# Patient Record
Sex: Male | Born: 1970
Health system: Southern US, Community
[De-identification: ages and names within clinical notes are randomized; demographics above are authoritative.]

## PROBLEM LIST (undated history)

## (undated) DIAGNOSIS — J939 Pneumothorax, unspecified: Secondary | ICD-10-CM

## (undated) DIAGNOSIS — L0390 Cellulitis, unspecified: Secondary | ICD-10-CM

## (undated) DIAGNOSIS — W3400XA Accidental discharge from unspecified firearms or gun, initial encounter: Secondary | ICD-10-CM

## (undated) HISTORY — PX: OTHER SURGICAL HISTORY: SHX169

## (undated) HISTORY — PX: HERNIA REPAIR: SHX51

---

## 1996-04-11 HISTORY — PX: HIATAL HERNIA REPAIR: SHX195

## 2001-08-27 ENCOUNTER — Emergency Department (HOSPITAL_COMMUNITY): Admission: EM | Admit: 2001-08-27 | Discharge: 2001-08-27 | Payer: Self-pay

## 2001-08-29 ENCOUNTER — Emergency Department (HOSPITAL_COMMUNITY): Admission: EM | Admit: 2001-08-29 | Discharge: 2001-08-29 | Payer: Self-pay | Admitting: Emergency Medicine

## 2001-10-24 ENCOUNTER — Emergency Department (HOSPITAL_COMMUNITY): Admission: EM | Admit: 2001-10-24 | Discharge: 2001-10-24 | Payer: Self-pay | Admitting: Emergency Medicine

## 2007-09-07 ENCOUNTER — Ambulatory Visit: Payer: Self-pay | Admitting: Family Medicine

## 2007-09-07 DIAGNOSIS — F17200 Nicotine dependence, unspecified, uncomplicated: Secondary | ICD-10-CM | POA: Insufficient documentation

## 2007-09-07 DIAGNOSIS — B351 Tinea unguium: Secondary | ICD-10-CM | POA: Insufficient documentation

## 2007-09-07 DIAGNOSIS — F1010 Alcohol abuse, uncomplicated: Secondary | ICD-10-CM | POA: Insufficient documentation

## 2007-09-07 DIAGNOSIS — F528 Other sexual dysfunction not due to a substance or known physiological condition: Secondary | ICD-10-CM | POA: Insufficient documentation

## 2008-01-29 ENCOUNTER — Emergency Department (HOSPITAL_COMMUNITY): Admission: EM | Admit: 2008-01-29 | Discharge: 2008-01-29 | Payer: Self-pay | Admitting: Emergency Medicine

## 2008-05-12 ENCOUNTER — Emergency Department (HOSPITAL_COMMUNITY): Admission: EM | Admit: 2008-05-12 | Discharge: 2008-05-12 | Payer: Self-pay | Admitting: Emergency Medicine

## 2011-01-22 ENCOUNTER — Emergency Department (HOSPITAL_COMMUNITY)
Admission: EM | Admit: 2011-01-22 | Discharge: 2011-01-22 | Disposition: A | Discharge status: Home / Self Care | Payer: BC Managed Care – PPO | Attending: Emergency Medicine | Admitting: Emergency Medicine

## 2011-01-22 DIAGNOSIS — M7989 Other specified soft tissue disorders: Secondary | ICD-10-CM | POA: Insufficient documentation

## 2011-01-22 DIAGNOSIS — L03119 Cellulitis of unspecified part of limb: Secondary | ICD-10-CM | POA: Insufficient documentation

## 2011-01-22 DIAGNOSIS — L02619 Cutaneous abscess of unspecified foot: Secondary | ICD-10-CM | POA: Insufficient documentation

## 2011-01-22 LAB — GLUCOSE, CAPILLARY

## 2011-01-23 ENCOUNTER — Emergency Department (HOSPITAL_COMMUNITY)
Admission: EM | Admit: 2011-01-23 | Discharge: 2011-01-23 | Disposition: A | Discharge status: Home / Self Care | Payer: BC Managed Care – PPO | Attending: Emergency Medicine | Admitting: Emergency Medicine

## 2011-01-23 DIAGNOSIS — L03119 Cellulitis of unspecified part of limb: Secondary | ICD-10-CM | POA: Insufficient documentation

## 2011-01-23 DIAGNOSIS — L02619 Cutaneous abscess of unspecified foot: Secondary | ICD-10-CM | POA: Insufficient documentation

## 2011-01-23 DIAGNOSIS — Z09 Encounter for follow-up examination after completed treatment for conditions other than malignant neoplasm: Secondary | ICD-10-CM | POA: Insufficient documentation

## 2011-04-04 ENCOUNTER — Emergency Department (HOSPITAL_COMMUNITY)
Admission: EM | Admit: 2011-04-04 | Discharge: 2011-04-05 | Disposition: A | Discharge status: Home / Self Care | Payer: BC Managed Care – PPO | Attending: Emergency Medicine | Admitting: Emergency Medicine

## 2011-04-04 ENCOUNTER — Encounter: Payer: Self-pay | Admitting: *Deleted

## 2011-04-04 DIAGNOSIS — M109 Gout, unspecified: Secondary | ICD-10-CM | POA: Insufficient documentation

## 2011-04-04 DIAGNOSIS — B351 Tinea unguium: Secondary | ICD-10-CM | POA: Insufficient documentation

## 2011-04-04 DIAGNOSIS — M79609 Pain in unspecified limb: Secondary | ICD-10-CM | POA: Insufficient documentation

## 2011-04-04 HISTORY — DX: Cellulitis, unspecified: L03.90

## 2011-04-04 MED ORDER — INDOMETHACIN 25 MG PO CAPS: 75.0000 mg | ORAL_CAPSULE | Freq: Two times a day (BID) | ORAL | Status: AC

## 2011-04-04 MED ORDER — COLCHICINE 0.6 MG PO TABS
0.6000 mg | ORAL_TABLET | Freq: Once | ORAL | Status: AC
  Administered 2011-04-04: 0.6 mg via ORAL
  Filled 2011-04-04: qty 1

## 2011-04-04 MED ORDER — COLCHICINE 0.6 MG PO TABS: ORAL_TABLET | ORAL | Status: DC

## 2011-04-04 MED ORDER — INDOMETHACIN 50 MG PO CAPS
75.0000 mg | ORAL_CAPSULE | Freq: Once | ORAL | Status: AC
  Administered 2011-04-04: 75 mg via ORAL
  Filled 2011-04-04: qty 1

## 2011-04-04 NOTE — ED Notes (Signed)
X 3 days ago, the pt began to experience right great toe pain and swelling.  Pt presents tonight c/o same.  Pt has hx of same for which he was treated for cellulitis, said treatment did reduce swelling but the pain never fully went away.

## 2011-04-04 NOTE — ED Notes (Signed)
Pt given discharge instructions and verbalizes understanding  

## 2011-04-04 NOTE — ED Provider Notes (Signed)
History     CSN: [id number]  Arrival date & time 04/04/11  2216   First MD Initiated Contact with Patient 04/04/11 2310      Chief Complaint  Patient presents with  . Toe Pain    (Consider location/radiation/quality/duration/timing/severity/associated sxs/prior treatment) HPI Comments: As reports, redness, swelling, and pain to the right great toe joint.  This is been recurrent has been treated in the past for cellulitis with antibiotic, which really did not help the pain, but did decrease.  The swelling.  This episode started 3 days ago.  He has taken no over-the-counter product.  He is also asking for treatment for his toenail fungus, which he has had for many years   Patient is a 40 y.o. male presenting with toe pain. The history is provided by the patient.  Toe Pain This is a recurrent problem. The current episode started in the past 7 days. The problem occurs constantly. The problem has been gradually worsening. Associated symptoms include joint swelling. The symptoms are aggravated by bending and exertion. He has tried nothing for the symptoms.    Past Medical History  Diagnosis Date  . Cellulitis     of right great toe    Past Surgical History  Procedure Date  . Hiatal hernia repair 1998    History reviewed. No pertinent family history.  History  Substance Use Topics  . Smoking status: Not on file  . Smokeless tobacco: Not on file  . Alcohol Use: 8.4 oz/week    14 Shots of liquor per week      Review of Systems  Constitutional: Negative.   HENT: Negative.   Eyes: Negative.   Respiratory: Negative.   Musculoskeletal: Positive for joint swelling.  Neurological: Negative.   Hematological: Negative.   Psychiatric/Behavioral: Negative.     Allergies  Review of patient's allergies indicates no known allergies.  Home Medications   Current Outpatient Rx  Name Route Sig Dispense Refill  . COLCHICINE 0.6 MG PO TABS  1 tablet every 2 hours til pain relief  or GI symptoms  ie diarrhea 10 tablet 0  . INDOMETHACIN 25 MG PO CAPS Oral Take 3 capsules (75 mg total) by mouth 2 (two) times daily with a meal. 27 capsule 0    BP 91/58  Pulse 91  Temp(Src) 99.9 F (37.7 C) (Oral)  Resp 20  SpO2 95%  Physical Exam  Constitutional: He is oriented to person, place, and time. He appears well-developed and well-nourished.  HENT:  Head: Normocephalic.  Eyes: Pupils are equal, round, and reactive to light.  Cardiovascular: Normal rate.   Musculoskeletal:       Erythema and warmth joint of right great toe  Neurological: He is oriented to person, place, and time.  Skin: No rash noted. There is erythema. No pallor.  Psychiatric: He has a normal mood and affect.    ED Course  Procedures (including critical care time)  Labs Reviewed - No data to display No results found.   1. Gout   2. ONYCHOMYCOSIS       MDM  This most likely gout        Arman Filter, NP 04/04/11 2342  Arman Filter, NP 04/04/11 2350

## 2011-04-06 NOTE — ED Provider Notes (Signed)
Evaluation and management procedures were performed by the PA/NP under my supervision/collaboration.   Felisa Bonier, MD 04/06/11 [phone]

## 2011-04-07 ENCOUNTER — Emergency Department (HOSPITAL_COMMUNITY)
Admission: EM | Admit: 2011-04-07 | Discharge: 2011-04-07 | Disposition: A | Discharge status: Home / Self Care | Payer: BC Managed Care – PPO | Attending: Emergency Medicine | Admitting: Emergency Medicine

## 2011-04-07 ENCOUNTER — Encounter (HOSPITAL_COMMUNITY): Payer: Self-pay | Admitting: *Deleted

## 2011-04-07 DIAGNOSIS — R51 Headache: Secondary | ICD-10-CM | POA: Insufficient documentation

## 2011-04-07 DIAGNOSIS — Z888 Allergy status to other drugs, medicaments and biological substances status: Secondary | ICD-10-CM | POA: Insufficient documentation

## 2011-04-07 DIAGNOSIS — R110 Nausea: Secondary | ICD-10-CM | POA: Insufficient documentation

## 2011-04-07 DIAGNOSIS — F17200 Nicotine dependence, unspecified, uncomplicated: Secondary | ICD-10-CM | POA: Insufficient documentation

## 2011-04-07 DIAGNOSIS — Z789 Other specified health status: Secondary | ICD-10-CM

## 2011-04-07 DIAGNOSIS — I10 Essential (primary) hypertension: Secondary | ICD-10-CM | POA: Insufficient documentation

## 2011-04-07 MED ORDER — NAPROXEN 500 MG PO TABS: 500.0000 mg | ORAL_TABLET | Freq: Two times a day (BID) | ORAL | Status: AC

## 2011-04-07 MED ORDER — OXYCODONE-ACETAMINOPHEN 5-325 MG PO TABS
2.0000 | ORAL_TABLET | Freq: Once | ORAL | Status: AC
  Administered 2011-04-07: 2 via ORAL
  Filled 2011-04-07: qty 2

## 2011-04-07 MED ORDER — OXYCODONE-ACETAMINOPHEN 5-325 MG PO TABS: 1.0000 | ORAL_TABLET | Freq: Four times a day (QID) | ORAL | Status: AC | PRN

## 2011-04-07 MED ORDER — IBUPROFEN 800 MG PO TABS
800.0000 mg | ORAL_TABLET | Freq: Once | ORAL | Status: AC
  Administered 2011-04-07: 800 mg via ORAL
  Filled 2011-04-07: qty 1

## 2011-04-07 NOTE — ED Provider Notes (Signed)
History     CSN: [id number]  Arrival date & time 04/07/11  4098   First MD Initiated Contact with Patient 04/07/11 [phone]      Chief Complaint  Patient presents with  . Headache  . Nausea  . Diarrhea    (Consider location/radiation/quality/duration/timing/severity/associated sxs/prior treatment) Patient is a 40 y.o. male presenting with headaches. The history is provided by the patient. No language interpreter was used.  Headache  This is a new problem. The current episode started 2 days ago. The problem occurs constantly. The problem has been gradually improving. Associated with: cholchicine/indomethacin. The pain is located in the temporal region. The quality of the pain is described as throbbing. The pain is moderate. The pain does not radiate. Associated symptoms include nausea. Pertinent negatives include no anorexia, no fever, no malaise/fatigue, no near-syncope, no orthopnea, no palpitations, no syncope, no shortness of breath and no vomiting. He has tried nothing for the symptoms.    Past Medical History  Diagnosis Date  . Cellulitis     of right great toe    Past Surgical History  Procedure Date  . Hiatal hernia repair 1998    No family history on file.  History  Substance Use Topics  . Smoking status: Current Everyday Smoker -- 0.5 packs/day for 15 years  . Smokeless tobacco: Not on file  . Alcohol Use: 8.4 oz/week    14 Shots of liquor per week      Review of Systems  Constitutional: Negative for fever, malaise/fatigue, activity change and appetite change.  HENT: Negative for congestion, sore throat, rhinorrhea, neck pain and neck stiffness.   Eyes: Negative for photophobia and visual disturbance.  Respiratory: Negative for cough and shortness of breath.   Cardiovascular: Negative for chest pain, palpitations, orthopnea, syncope and near-syncope.  Gastrointestinal: Positive for nausea. Negative for vomiting and anorexia.  Genitourinary: Negative for flank  pain.  Neurological: Positive for headaches. Negative for light-headedness and numbness.  All other systems reviewed and are negative.    Allergies  Review of patient's allergies indicates no known allergies.  Home Medications   Current Outpatient Rx  Name Route Sig Dispense Refill  . ACETAMINOPHEN 500 MG PO TABS Oral Take 500 mg by mouth every 6 (six) hours as needed. pain     . COLCHICINE 0.6 MG PO TABS Oral Take 0.6 mg by mouth daily. 1 tablet every 2 hours til pain relief or GI symptoms  ie diarrhea     . INDOMETHACIN 25 MG PO CAPS Oral Take 3 capsules (75 mg total) by mouth 2 (two) times daily with a meal. 27 capsule 0  . NAPROXEN 500 MG PO TABS Oral Take 1 tablet (500 mg total) by mouth 2 (two) times daily. 30 tablet 0  . OXYCODONE-ACETAMINOPHEN 5-325 MG PO TABS Oral Take 1-2 tablets by mouth every 6 (six) hours as needed for pain. 20 tablet 0    BP 107/70  Pulse 101  Temp(Src) 97.8 F (36.6 C) (Oral)  Resp 20  SpO2 99%  Physical Exam  Nursing note and vitals reviewed. Constitutional: He is oriented to person, place, and time. He appears well-developed and well-nourished. No distress.  HENT:  Head: Normocephalic and atraumatic.  Mouth/Throat: Oropharynx is clear and moist. No oropharyngeal exudate.  Eyes: Conjunctivae and EOM are normal. Pupils are equal, round, and reactive to light.  Neck: Normal range of motion. Neck supple.  Cardiovascular: Normal rate, regular rhythm, normal heart sounds and intact distal pulses.  Exam reveals no  gallop and no friction rub.   No murmur heard. Pulmonary/Chest: Effort normal and breath sounds normal. No respiratory distress.  Abdominal: Soft. Bowel sounds are normal. There is no tenderness.  Musculoskeletal: Normal range of motion. He exhibits no tenderness.  Neurological: He is alert and oriented to person, place, and time. He has normal strength. No cranial nerve deficit or sensory deficit.  Skin: Skin is warm and dry. No rash  noted.    ED Course  Procedures (including critical care time)  Labs Reviewed - No data to display No results found.   1. Headache   2. Medication intolerance       MDM  No concern for malignant cause of headache such as subarachnoid hemorrhage. Headache was gradual in onset and has improved. He has no associated neurologic symptoms. With this is secondary to medication reaction. He'll be treated with naproxen and Percocet for pain. Followup as scheduled primary care physician.  Provided sx for which to return to the ED        Dayton Bailiff, MD 04/07/11 [phone]

## 2011-04-07 NOTE — ED Notes (Signed)
Pt reports h/a and n/v/d since Christmas day.  Pt denies any vomiting today but reports mild diarrhea.  Pt is in no distress at this time.  Pt sitting on the side of the stretcher while being assessed.  Pt's family at bedside.

## 2011-04-07 NOTE — ED Notes (Signed)
Pt reports having severe HA, nausea, and diarrhea after taking colchicine and indomethacin on 12/25. Believes this is a reaction to the meds.

## 2011-10-29 ENCOUNTER — Emergency Department (HOSPITAL_COMMUNITY): Admission: EM | Admit: 2011-10-29 | Discharge: 2011-10-29 | Discharge status: Against Medical Advice | Payer: Self-pay

## 2012-09-18 ENCOUNTER — Emergency Department (HOSPITAL_COMMUNITY): Payer: BC Managed Care – PPO

## 2012-09-18 ENCOUNTER — Emergency Department (HOSPITAL_COMMUNITY)
Admission: EM | Admit: 2012-09-18 | Discharge: 2012-09-18 | Disposition: A | Discharge status: Home / Self Care | Payer: BC Managed Care – PPO | Attending: Emergency Medicine | Admitting: Emergency Medicine

## 2012-09-18 ENCOUNTER — Encounter (HOSPITAL_COMMUNITY): Payer: Self-pay

## 2012-09-18 DIAGNOSIS — Z8709 Personal history of other diseases of the respiratory system: Secondary | ICD-10-CM | POA: Insufficient documentation

## 2012-09-18 DIAGNOSIS — Z79899 Other long term (current) drug therapy: Secondary | ICD-10-CM | POA: Insufficient documentation

## 2012-09-18 DIAGNOSIS — R1310 Dysphagia, unspecified: Secondary | ICD-10-CM

## 2012-09-18 DIAGNOSIS — F17200 Nicotine dependence, unspecified, uncomplicated: Secondary | ICD-10-CM | POA: Insufficient documentation

## 2012-09-18 DIAGNOSIS — M542 Cervicalgia: Secondary | ICD-10-CM | POA: Insufficient documentation

## 2012-09-18 DIAGNOSIS — R110 Nausea: Secondary | ICD-10-CM | POA: Insufficient documentation

## 2012-09-18 DIAGNOSIS — Z872 Personal history of diseases of the skin and subcutaneous tissue: Secondary | ICD-10-CM | POA: Insufficient documentation

## 2012-09-18 HISTORY — DX: Pneumothorax, unspecified: J93.9

## 2012-09-18 NOTE — ED Notes (Signed)
Patient reports that he was eating a cheeseburger and feels like something is stuck in his throat. Patient denies any breathing difficulties, but is having pain when he breathes in and swallows.
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
## 2013-05-13 ENCOUNTER — Emergency Department (HOSPITAL_COMMUNITY)
Admission: EM | Admit: 2013-05-13 | Discharge: 2013-05-13 | Disposition: A | Discharge status: Home / Self Care | Payer: BC Managed Care – PPO | Attending: Emergency Medicine | Admitting: Emergency Medicine

## 2013-05-13 ENCOUNTER — Encounter (HOSPITAL_COMMUNITY): Payer: Self-pay | Admitting: Emergency Medicine

## 2013-05-13 ENCOUNTER — Emergency Department (HOSPITAL_COMMUNITY): Payer: BC Managed Care – PPO

## 2013-05-13 DIAGNOSIS — Z872 Personal history of diseases of the skin and subcutaneous tissue: Secondary | ICD-10-CM | POA: Insufficient documentation

## 2013-05-13 DIAGNOSIS — R109 Unspecified abdominal pain: Secondary | ICD-10-CM | POA: Insufficient documentation

## 2013-05-13 DIAGNOSIS — F17200 Nicotine dependence, unspecified, uncomplicated: Secondary | ICD-10-CM | POA: Insufficient documentation

## 2013-05-13 DIAGNOSIS — Z8709 Personal history of other diseases of the respiratory system: Secondary | ICD-10-CM | POA: Insufficient documentation

## 2013-05-13 DIAGNOSIS — A084 Viral intestinal infection, unspecified: Secondary | ICD-10-CM

## 2013-05-13 DIAGNOSIS — A088 Other specified intestinal infections: Secondary | ICD-10-CM | POA: Insufficient documentation

## 2013-05-13 LAB — CBC WITH DIFFERENTIAL/PLATELET
BASOS ABS: 0 10*3/uL (ref 0.0–0.1)
BASOS PCT: 0 % (ref 0–1)
EOS PCT: 3 % (ref 0–5)
Eosinophils Absolute: 0.4 10*3/uL (ref 0.0–0.7)
HEMATOCRIT: 37.2 % — AB (ref 39.0–52.0)
HEMOGLOBIN: 13.4 g/dL (ref 13.0–17.0)
LYMPHS ABS: 4.2 10*3/uL — AB (ref 0.7–4.0)
Lymphocytes Relative: 32 % (ref 12–46)
MCH: 29.5 pg (ref 26.0–34.0)
MCHC: 36 g/dL (ref 30.0–36.0)
MCV: 81.9 fL (ref 78.0–100.0)
Monocytes Absolute: 0.7 10*3/uL (ref 0.1–1.0)
Monocytes Relative: 6 % (ref 3–12)
NEUTROS PCT: 59 % (ref 43–77)
Neutro Abs: 7.8 10*3/uL — ABNORMAL HIGH (ref 1.7–7.7)
PLATELETS: 235 10*3/uL (ref 150–400)
RBC: 4.54 MIL/uL (ref 4.22–5.81)
RDW: 13.1 % (ref 11.5–15.5)
WBC: 13.2 10*3/uL — ABNORMAL HIGH (ref 4.0–10.5)

## 2013-05-13 LAB — COMPREHENSIVE METABOLIC PANEL
ALBUMIN: 4 g/dL (ref 3.5–5.2)
ALK PHOS: 87 U/L (ref 39–117)
ALT: 15 U/L (ref 0–53)
AST: 25 U/L (ref 0–37)
BUN: 13 mg/dL (ref 6–23)
CHLORIDE: 101 meq/L (ref 96–112)
CO2: 25 meq/L (ref 19–32)
CREATININE: 1.18 mg/dL (ref 0.50–1.35)
Calcium: 9.5 mg/dL (ref 8.4–10.5)
GFR calc Af Amer: 87 mL/min — ABNORMAL LOW (ref >90)
GFR, EST NON AFRICAN AMERICAN: 75 mL/min — AB (ref >90)
GLUCOSE: 125 mg/dL — AB (ref 70–99)
POTASSIUM: 3.3 meq/L — AB (ref 3.7–5.3)
Sodium: 141 mEq/L (ref 137–147)
TOTAL PROTEIN: 7.8 g/dL (ref 6.0–8.3)

## 2013-05-13 LAB — LIPASE, BLOOD: LIPASE: 46 U/L (ref 11–59)

## 2013-05-13 MED ORDER — IOHEXOL 300 MG/ML  SOLN
50.0000 mL | Freq: Once | INTRAMUSCULAR | Status: AC | PRN
  Administered 2013-05-13: 50 mL via ORAL

## 2013-05-13 MED ORDER — POTASSIUM CHLORIDE CRYS ER 20 MEQ PO TBCR
40.0000 meq | EXTENDED_RELEASE_TABLET | Freq: Once | ORAL | Status: AC
  Administered 2013-05-13: 40 meq via ORAL
  Filled 2013-05-13: qty 2

## 2013-05-13 MED ORDER — ONDANSETRON HCL 8 MG PO TABS: 8.0000 mg | ORAL_TABLET | Freq: Three times a day (TID) | ORAL | Status: DC | PRN

## 2013-05-13 MED ORDER — PANTOPRAZOLE SODIUM 40 MG IV SOLR
40.0000 mg | Freq: Once | INTRAVENOUS | Status: AC
  Administered 2013-05-13: 40 mg via INTRAVENOUS
  Filled 2013-05-13: qty 40

## 2013-05-13 MED ORDER — IOHEXOL 300 MG/ML  SOLN
100.0000 mL | Freq: Once | INTRAMUSCULAR | Status: AC | PRN
  Administered 2013-05-13: 100 mL via INTRAVENOUS

## 2013-05-13 MED ORDER — MORPHINE SULFATE 4 MG/ML IJ SOLN
4.0000 mg | Freq: Once | INTRAMUSCULAR | Status: AC
Start: 2013-05-13 — End: 2013-05-13
  Administered 2013-05-13: 4 mg via INTRAVENOUS
  Filled 2013-05-13: qty 1

## 2013-05-13 MED ORDER — HYDROCODONE-ACETAMINOPHEN 5-325 MG PO TABS
1.0000 | ORAL_TABLET | ORAL | Status: DC | PRN
Start: 2013-05-13 — End: 2013-10-21

## 2013-05-13 MED ORDER — MORPHINE SULFATE 4 MG/ML IJ SOLN
4.0000 mg | Freq: Once | INTRAMUSCULAR | Status: AC
  Administered 2013-05-13: 4 mg via INTRAVENOUS
  Filled 2013-05-13: qty 1

## 2013-05-13 MED ORDER — ONDANSETRON HCL 4 MG/2ML IJ SOLN
4.0000 mg | Freq: Once | INTRAMUSCULAR | Status: AC
  Administered 2013-05-13: 4 mg via INTRAVENOUS
  Filled 2013-05-13: qty 2

## 2013-05-13 NOTE — ED Notes (Signed)
Pt c/o diarrhea onset this am, +emesis x 4, pt c/o L sided abd pain. Active vomiting in WR

## 2013-05-13 NOTE — ED Provider Notes (Signed)
Patient received at shift change from Ruby Colaatherine Schinlever, PA-C.   43 yo male presents with LEFT sided abdominal pain, with associated N/V/D that started with Diarrhea yesterday morning. Patient has leukocytosis to 13.2.  Hypokalemia likely related to N/V/D Lipase negative Acute abd series shows scattered air-fluid levels, possibly enteritis, no obvious obstruction noted.  Will get CT to rule out SBO.    CT shows no signs of SBO. Plan to treat patient's symptoms with follow up with PCP if symptoms persist. Return precautions given if symptoms should worsen.     Allen NorrisJacob Gray CrumpLackey, PA-C 05/14/13 [phone]

## 2013-05-13 NOTE — Discharge Instructions (Signed)
Take vicodin as prescribed for severe pain.  Do not drive within four hours of taking this medication (may cause drowsiness or confusion).   Take zofran as needed for nausea.  You can take over the counter imodium if you are having frequent diarrhea, but discontinue if you notice any blood in your stool.  Drink plenty of fluids.  You should return to the ER if you develop fever, worsening pain or uncontrolled vomiting.

## 2013-05-13 NOTE — ED Notes (Signed)
Patient transported to X-ray 

## 2013-05-13 NOTE — ED Notes (Signed)
Patient transported to CT 
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
## 2013-05-15 NOTE — ED Provider Notes (Signed)
Medical screening examination/treatment/procedure(s) were performed by non-physician practitioner and as supervising physician I was immediately available for consultation/collaboration.    Vida RollerBrian D Daila Elbert, MD 05/15/13 [phone]

## 2013-10-21 ENCOUNTER — Encounter (HOSPITAL_COMMUNITY): Payer: Self-pay | Admitting: Emergency Medicine

## 2013-10-21 ENCOUNTER — Emergency Department (HOSPITAL_COMMUNITY): Payer: BC Managed Care – PPO

## 2013-10-21 ENCOUNTER — Emergency Department (HOSPITAL_COMMUNITY)
Admission: EM | Admit: 2013-10-21 | Discharge: 2013-10-22 | Disposition: A | Discharge status: Home / Self Care | Payer: BC Managed Care – PPO | Attending: Emergency Medicine | Admitting: Emergency Medicine

## 2013-10-21 DIAGNOSIS — F17200 Nicotine dependence, unspecified, uncomplicated: Secondary | ICD-10-CM | POA: Insufficient documentation

## 2013-10-21 DIAGNOSIS — Z8709 Personal history of other diseases of the respiratory system: Secondary | ICD-10-CM | POA: Insufficient documentation

## 2013-10-21 DIAGNOSIS — K8020 Calculus of gallbladder without cholecystitis without obstruction: Secondary | ICD-10-CM

## 2013-10-21 DIAGNOSIS — R1110 Vomiting, unspecified: Secondary | ICD-10-CM

## 2013-10-21 DIAGNOSIS — Z872 Personal history of diseases of the skin and subcutaneous tissue: Secondary | ICD-10-CM | POA: Insufficient documentation

## 2013-10-21 LAB — CBC WITH DIFFERENTIAL/PLATELET
BASOS ABS: 0 10*3/uL (ref 0.0–0.1)
BASOS PCT: 0 % (ref 0–1)
EOS ABS: 0.4 10*3/uL (ref 0.0–0.7)
EOS PCT: 4 % (ref 0–5)
HCT: 35.1 % — ABNORMAL LOW (ref 39.0–52.0)
Hemoglobin: 12.7 g/dL — ABNORMAL LOW (ref 13.0–17.0)
Lymphocytes Relative: 39 % (ref 12–46)
Lymphs Abs: 4.1 10*3/uL — ABNORMAL HIGH (ref 0.7–4.0)
MCH: 29 pg (ref 26.0–34.0)
MCHC: 36.2 g/dL — AB (ref 30.0–36.0)
MCV: 80.1 fL (ref 78.0–100.0)
MONO ABS: 0.6 10*3/uL (ref 0.1–1.0)
MONOS PCT: 6 % (ref 3–12)
NEUTROS PCT: 51 % (ref 43–77)
Neutro Abs: 5.3 10*3/uL (ref 1.7–7.7)
Platelets: 206 10*3/uL (ref 150–400)
RBC: 4.38 MIL/uL (ref 4.22–5.81)
RDW: 13 % (ref 11.5–15.5)
WBC: 10.4 10*3/uL (ref 4.0–10.5)

## 2013-10-21 LAB — LIPASE, BLOOD: LIPASE: 37 U/L (ref 11–59)

## 2013-10-21 LAB — BASIC METABOLIC PANEL
Anion gap: 14 (ref 5–15)
BUN: 11 mg/dL (ref 6–23)
CHLORIDE: 101 meq/L (ref 96–112)
CO2: 24 mEq/L (ref 19–32)
CREATININE: 0.92 mg/dL (ref 0.50–1.35)
Calcium: 9.9 mg/dL (ref 8.4–10.5)
GFR calc Af Amer: >90 mL/min (ref >90)
GFR calc non Af Amer: >90 mL/min (ref >90)
Glucose, Bld: 99 mg/dL (ref 70–99)
Potassium: 3.7 mEq/L (ref 3.7–5.3)
SODIUM: 139 meq/L (ref 137–147)

## 2013-10-21 LAB — AMYLASE: Amylase: 65 U/L (ref 0–105)

## 2013-10-21 MED ORDER — IOHEXOL 300 MG/ML  SOLN
100.0000 mL | Freq: Once | INTRAMUSCULAR | Status: AC | PRN
  Administered 2013-10-21: 100 mL via INTRAVENOUS

## 2013-10-21 MED ORDER — ONDANSETRON 8 MG PO TBDP
8.0000 mg | ORAL_TABLET | Freq: Once | ORAL | Status: AC
  Administered 2013-10-21: 8 mg via ORAL
  Filled 2013-10-21: qty 1

## 2013-10-21 MED ORDER — IOHEXOL 300 MG/ML  SOLN
50.0000 mL | Freq: Once | INTRAMUSCULAR | Status: AC | PRN
  Administered 2013-10-21: 50 mL via ORAL

## 2013-10-21 MED ORDER — HYDROMORPHONE HCL PF 1 MG/ML IJ SOLN
1.0000 mg | Freq: Once | INTRAMUSCULAR | Status: AC
  Administered 2013-10-21: 1 mg via INTRAVENOUS
  Filled 2013-10-21: qty 1

## 2013-10-21 MED ORDER — SODIUM CHLORIDE 0.9 % IV BOLUS (SEPSIS)
1000.0000 mL | Freq: Once | INTRAVENOUS | Status: AC
  Administered 2013-10-21: 1000 mL via INTRAVENOUS

## 2013-10-21 MED ORDER — ONDANSETRON HCL 4 MG/2ML IJ SOLN
4.0000 mg | Freq: Once | INTRAMUSCULAR | Status: AC
  Administered 2013-10-21: 4 mg via INTRAVENOUS
  Filled 2013-10-21: qty 2
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
## 2013-10-21 NOTE — ED Notes (Signed)
Pt complains of abd pain and vomiting since last night

## 2013-10-21 NOTE — ED Notes (Signed)
Pt unable to void at this time. 

## 2013-10-22 ENCOUNTER — Emergency Department (HOSPITAL_COMMUNITY): Payer: BC Managed Care – PPO

## 2013-10-22 LAB — HEPATIC FUNCTION PANEL
ALT: 20 U/L (ref 0–53)
AST: 23 U/L (ref 0–37)
Albumin: 4.1 g/dL (ref 3.5–5.2)
Alkaline Phosphatase: 74 U/L (ref 39–117)
BILIRUBIN TOTAL: 0.2 mg/dL — AB (ref 0.3–1.2)
Total Protein: 7.8 g/dL (ref 6.0–8.3)

## 2013-10-22 LAB — URINALYSIS, ROUTINE W REFLEX MICROSCOPIC
Bilirubin Urine: NEGATIVE
Glucose, UA: NEGATIVE mg/dL
Hgb urine dipstick: NEGATIVE
KETONES UR: NEGATIVE mg/dL
LEUKOCYTES UA: NEGATIVE
NITRITE: NEGATIVE
PH: 5 (ref 5.0–8.0)
Protein, ur: NEGATIVE mg/dL
Specific Gravity, Urine: 1.039 — ABNORMAL HIGH (ref 1.005–1.030)
UROBILINOGEN UA: 0.2 mg/dL (ref 0.0–1.0)

## 2013-10-22 MED ORDER — HYDROMORPHONE HCL PF 1 MG/ML IJ SOLN
1.0000 mg | Freq: Once | INTRAMUSCULAR | Status: AC
  Administered 2013-10-22: 1 mg via INTRAVENOUS
  Filled 2013-10-22: qty 1

## 2013-10-22 MED ORDER — PROMETHAZINE HCL 25 MG PO TABS: 25.0000 mg | ORAL_TABLET | Freq: Four times a day (QID) | ORAL | Status: DC | PRN

## 2013-10-22 MED ORDER — OXYCODONE-ACETAMINOPHEN 5-325 MG PO TABS: 1.0000 | ORAL_TABLET | ORAL | Status: DC | PRN

## 2013-10-22 MED ORDER — ONDANSETRON 4 MG PO TBDP: ORAL_TABLET | ORAL | Status: DC

## 2013-10-22 MED ORDER — HYDROCODONE-ACETAMINOPHEN 5-325 MG PO TABS: 2.0000 | ORAL_TABLET | ORAL | Status: DC | PRN

## 2013-10-22 NOTE — ED Notes (Signed)
US at bedside

## 2013-10-22 NOTE — ED Provider Notes (Signed)
Patient's abdominal pain has resolved. Abdomen is benign. He is sleeping comfortably in the hospital bed. He has normal LFTs, white blood cell count and lipase. Abdominal ultrasound shows cholelithiasis with very mild gallbladder wall thickening. Patient is advised to followup with a general surgeon for elective removal of his gallbladder. He's been given dietary instructions and encouraged to return for fever, persistent pain or vomiting. Patient voices understanding.  [name], MD 10/22/13 [phone]

## 2013-10-22 NOTE — ED Notes (Signed)
Dr Yelverton at bedside.  

## 2013-10-22 NOTE — Discharge Instructions (Signed)
Call and make an appointment to followup with a general surgeon for removal of your gallbladder, electively. Return immediately for fever, persistent vomiting, persistent pain or for any concerns.  Cholelithiasis Cholelithiasis (also called gallstones) is a form of gallbladder disease in which gallstones form in your gallbladder. The gallbladder is an organ that stores bile made in the liver, which helps digest fats. Gallstones begin as small crystals and slowly grow into stones. Gallstone pain occurs when the gallbladder spasms and a gallstone is blocking the duct. Pain can also occur when a stone passes out of the duct.  RISK FACTORS  Being male.   Having multiple pregnancies. Health care providers sometimes advise removing diseased gallbladders before future pregnancies.   Being obese.  Eating a diet heavy in fried foods and fat.   Being older than 60 years and increasing age.   Prolonged use of medicines containing male hormones.   Having diabetes mellitus.   Rapidly losing weight.   Having a family history of gallstones (heredity).  SYMPTOMS  Nausea.   Vomiting.  Abdominal pain.   Yellowing of the skin (jaundice).   Sudden pain. It may persist from several minutes to several hours.  Fever.   Tenderness to the touch. In some cases, when gallstones do not move into the bile duct, people have no pain or symptoms. These are called "silent" gallstones.  TREATMENT Silent gallstones do not need treatment. In severe cases, emergency surgery may be required. Options for treatment include:  Surgery to remove the gallbladder. This is the most common treatment.  Medicines. These do not always work and may take 6-12 months or more to work.  Shock wave treatment (extracorporeal biliary lithotripsy). In this treatment an ultrasound machine sends shock waves to the gallbladder to break gallstones into smaller pieces that can pass into the intestines or be dissolved  by medicine. HOME CARE INSTRUCTIONS   Only take over-the-counter or prescription medicines for pain, discomfort, or fever as directed by your health care provider.   Follow a low-fat diet until seen again by your health care provider. Fat causes the gallbladder to contract, which can result in pain.   Follow up with your health care provider as directed. Attacks are almost always recurrent and surgery is usually required for permanent treatment.  SEEK IMMEDIATE MEDICAL CARE IF:   Your pain increases and is not controlled by medicines.   You have a fever or persistent symptoms for more than 2-3 days.   You have a fever and your symptoms suddenly get worse.   You have persistent nausea and vomiting.  MAKE SURE YOU:   Understand these instructions.  Will watch your condition.  Will get help right away if you are not doing well or get worse. Document Released: 03/24/2005 Document Revised: 11/28/2012 Document Reviewed: 09/19/2012 [facility] Patient Information 2015 [employer] [place]. This information is not intended to replace advice given to you by your health care provider. Make sure you discuss any questions you have with your health care provider.

## 2013-11-11 ENCOUNTER — Ambulatory Visit (INDEPENDENT_AMBULATORY_CARE_PROVIDER_SITE_OTHER): Payer: BC Managed Care – PPO | Admitting: General Surgery

## 2014-03-24 ENCOUNTER — Emergency Department (HOSPITAL_COMMUNITY): Payer: BC Managed Care – PPO

## 2014-03-24 ENCOUNTER — Inpatient Hospital Stay (HOSPITAL_COMMUNITY)
Admission: EM | Admit: 2014-03-24 | Discharge: 2014-03-27 | DRG: 871 | Disposition: A | Discharge status: Home / Self Care | Payer: BC Managed Care – PPO | Attending: Internal Medicine | Admitting: Internal Medicine

## 2014-03-24 ENCOUNTER — Encounter (HOSPITAL_COMMUNITY): Payer: Self-pay | Admitting: Emergency Medicine

## 2014-03-24 DIAGNOSIS — J189 Pneumonia, unspecified organism: Secondary | ICD-10-CM | POA: Diagnosis present

## 2014-03-24 DIAGNOSIS — F17200 Nicotine dependence, unspecified, uncomplicated: Secondary | ICD-10-CM | POA: Diagnosis present

## 2014-03-24 DIAGNOSIS — L03211 Cellulitis of face: Secondary | ICD-10-CM | POA: Diagnosis present

## 2014-03-24 DIAGNOSIS — K047 Periapical abscess without sinus: Secondary | ICD-10-CM | POA: Diagnosis present

## 2014-03-24 DIAGNOSIS — A419 Sepsis, unspecified organism: Principal | ICD-10-CM | POA: Diagnosis present

## 2014-03-24 DIAGNOSIS — Z79899 Other long term (current) drug therapy: Secondary | ICD-10-CM

## 2014-03-24 DIAGNOSIS — R9431 Abnormal electrocardiogram [ECG] [EKG]: Secondary | ICD-10-CM | POA: Diagnosis present

## 2014-03-24 DIAGNOSIS — R0902 Hypoxemia: Secondary | ICD-10-CM | POA: Diagnosis not present

## 2014-03-24 DIAGNOSIS — F17210 Nicotine dependence, cigarettes, uncomplicated: Secondary | ICD-10-CM | POA: Diagnosis present

## 2014-03-24 DIAGNOSIS — R519 Headache, unspecified: Secondary | ICD-10-CM

## 2014-03-24 DIAGNOSIS — N179 Acute kidney failure, unspecified: Secondary | ICD-10-CM | POA: Diagnosis present

## 2014-03-24 DIAGNOSIS — Z720 Tobacco use: Secondary | ICD-10-CM

## 2014-03-24 DIAGNOSIS — R51 Headache: Secondary | ICD-10-CM

## 2014-03-24 HISTORY — DX: Accidental discharge from unspecified firearms or gun, initial encounter: W34.00XA

## 2014-03-24 LAB — BASIC METABOLIC PANEL WITH GFR
Anion gap: 19 — ABNORMAL HIGH (ref 5–15)
BUN: 13 mg/dL (ref 6–23)
CO2: 20 meq/L (ref 19–32)
Calcium: 9.5 mg/dL (ref 8.4–10.5)
Chloride: 99 meq/L (ref 96–112)
Creatinine, Ser: 1.57 mg/dL — ABNORMAL HIGH (ref 0.50–1.35)
GFR calc Af Amer: 62 mL/min — ABNORMAL LOW
GFR calc non Af Amer: 53 mL/min — ABNORMAL LOW
Glucose, Bld: 89 mg/dL (ref 70–99)
Potassium: 4.3 meq/L (ref 3.7–5.3)
Sodium: 138 meq/L (ref 137–147)

## 2014-03-24 LAB — CBC WITH DIFFERENTIAL/PLATELET
Basophils Absolute: 0 K/uL (ref 0.0–0.1)
Basophils Relative: 0 % (ref 0–1)
Eosinophils Absolute: 0.1 K/uL (ref 0.0–0.7)
Eosinophils Relative: 1 % (ref 0–5)
HCT: 39.8 % (ref 39.0–52.0)
Hemoglobin: 13.9 g/dL (ref 13.0–17.0)
Lymphocytes Relative: 23 % (ref 12–46)
Lymphs Abs: 4 K/uL (ref 0.7–4.0)
MCH: 29.3 pg (ref 26.0–34.0)
MCHC: 34.9 g/dL (ref 30.0–36.0)
MCV: 84 fL (ref 78.0–100.0)
Monocytes Absolute: 1.5 K/uL — ABNORMAL HIGH (ref 0.1–1.0)
Monocytes Relative: 9 % (ref 3–12)
Neutro Abs: 12 K/uL — ABNORMAL HIGH (ref 1.7–7.7)
Neutrophils Relative %: 67 % (ref 43–77)
Platelets: 221 K/uL (ref 150–400)
RBC: 4.74 MIL/uL (ref 4.22–5.81)
RDW: 13 % (ref 11.5–15.5)
WBC: 17.6 K/uL — ABNORMAL HIGH (ref 4.0–10.5)

## 2014-03-24 LAB — TROPONIN I
Troponin I: <0.3 ng/mL (ref <0.30)
Troponin I: <0.3 ng/mL (ref <0.30)

## 2014-03-24 LAB — PROTIME-INR
INR: 1.4 (ref 0.00–1.49)
PROTHROMBIN TIME: 17.3 s — AB (ref 11.6–15.2)

## 2014-03-24 LAB — PRO B NATRIURETIC PEPTIDE: Pro B Natriuretic peptide (BNP): 65.7 pg/mL (ref 0–125)

## 2014-03-24 LAB — LACTIC ACID, PLASMA: Lactic Acid, Venous: 0.6 mmol/L (ref 0.5–2.2)

## 2014-03-24 MED ORDER — OXYCODONE-ACETAMINOPHEN 5-325 MG PO TABS
1.0000 | ORAL_TABLET | ORAL | Status: DC | PRN
  Administered 2014-03-24: 1 via ORAL
  Administered 2014-03-25 – 2014-03-27 (×5): 2 via ORAL
  Filled 2014-03-24: qty 1
  Filled 2014-03-24 (×5): qty 2
  Filled 2014-03-24: qty 1

## 2014-03-24 MED ORDER — HYDROMORPHONE HCL 1 MG/ML IJ SOLN
1.0000 mg | Freq: Once | INTRAMUSCULAR | Status: AC
  Administered 2014-03-24: 1 mg via INTRAVENOUS
  Filled 2014-03-24: qty 1

## 2014-03-24 MED ORDER — FENTANYL CITRATE 0.05 MG/ML IJ SOLN
50.0000 ug | Freq: Once | INTRAMUSCULAR | Status: AC
  Administered 2014-03-24: 50 ug via INTRAVENOUS
  Filled 2014-03-24: qty 2

## 2014-03-24 MED ORDER — IBUPROFEN 200 MG PO TABS: 400.0000 mg | ORAL_TABLET | Freq: Four times a day (QID) | ORAL | Status: DC | PRN

## 2014-03-24 MED ORDER — CLINDAMYCIN PHOSPHATE 600 MG/50ML IV SOLN
600.0000 mg | Freq: Once | INTRAVENOUS | Status: AC
  Administered 2014-03-24: 600 mg via INTRAVENOUS
  Filled 2014-03-24: qty 50

## 2014-03-24 MED ORDER — NICOTINE 14 MG/24HR TD PT24
14.0000 mg | MEDICATED_PATCH | Freq: Every day | TRANSDERMAL | Status: DC
  Filled 2014-03-24 (×4): qty 1

## 2014-03-24 MED ORDER — SODIUM CHLORIDE 0.9 % IV SOLN
Freq: Once | INTRAVENOUS | Status: AC
  Administered 2014-03-24: 19:00:00 via INTRAVENOUS

## 2014-03-24 MED ORDER — HEPARIN SODIUM (PORCINE) 5000 UNIT/ML IJ SOLN
5000.0000 [IU] | Freq: Three times a day (TID) | INTRAMUSCULAR | Status: DC
  Administered 2014-03-24 – 2014-03-27 (×9): 5000 [IU] via SUBCUTANEOUS
  Filled 2014-03-24 (×11): qty 1

## 2014-03-24 MED ORDER — IOHEXOL 300 MG/ML  SOLN
80.0000 mL | Freq: Once | INTRAMUSCULAR | Status: AC | PRN
  Administered 2014-03-24: 80 mL via INTRAVENOUS

## 2014-03-24 MED ORDER — LEVOFLOXACIN IN D5W 750 MG/150ML IV SOLN
750.0000 mg | Freq: Once | INTRAVENOUS | Status: AC
  Administered 2014-03-24: 750 mg via INTRAVENOUS
  Filled 2014-03-24: qty 150

## 2014-03-24 MED ORDER — KETOROLAC TROMETHAMINE 30 MG/ML IJ SOLN
30.0000 mg | Freq: Once | INTRAMUSCULAR | Status: AC
  Administered 2014-03-24: 30 mg via INTRAVENOUS
  Filled 2014-03-24: qty 1

## 2014-03-24 MED ORDER — LEVOFLOXACIN IN D5W 750 MG/150ML IV SOLN
750.0000 mg | INTRAVENOUS | Status: DC
  Filled 2014-03-24: qty 150

## 2014-03-24 MED ORDER — SODIUM CHLORIDE 0.9 % IV BOLUS (SEPSIS)
2000.0000 mL | Freq: Once | INTRAVENOUS | Status: AC
  Administered 2014-03-24: 2000 mL via INTRAVENOUS

## 2014-03-24 MED ORDER — ONDANSETRON HCL 4 MG PO TABS: 4.0000 mg | ORAL_TABLET | Freq: Four times a day (QID) | ORAL | Status: DC | PRN

## 2014-03-24 MED ORDER — SODIUM CHLORIDE 0.9 % IV BOLUS (SEPSIS)
500.0000 mL | Freq: Once | INTRAVENOUS | Status: AC
  Administered 2014-03-24: 500 mL via INTRAVENOUS

## 2014-03-24 MED ORDER — CLINDAMYCIN PHOSPHATE 600 MG/50ML IV SOLN
600.0000 mg | Freq: Three times a day (TID) | INTRAVENOUS | Status: DC
  Administered 2014-03-24 – 2014-03-25 (×2): 600 mg via INTRAVENOUS
  Filled 2014-03-24 (×3): qty 50

## 2014-03-24 MED ORDER — SODIUM CHLORIDE 0.9 % IV SOLN
Freq: Once | INTRAVENOUS | Status: AC
  Administered 2014-03-24: 20:00:00 via INTRAVENOUS

## 2014-03-24 MED ORDER — ONDANSETRON HCL 4 MG/2ML IJ SOLN: 4.0000 mg | Freq: Four times a day (QID) | INTRAMUSCULAR | Status: DC | PRN

## 2014-03-24 MED ORDER — SODIUM CHLORIDE 0.9 % IV SOLN
INTRAVENOUS | Status: DC
  Administered 2014-03-24 – 2014-03-25 (×3): via INTRAVENOUS
  Administered 2014-03-25: 150 mL/h via INTRAVENOUS
  Administered 2014-03-26 – 2014-03-27 (×2): via INTRAVENOUS

## 2014-03-24 NOTE — ED Notes (Addendum)
Pt reports left tooth/mouth abscess since Friday. Pt reports swelling worse since and severe pain to area. Pt hx of abscess years ago.

## 2014-03-24 NOTE — ED Notes (Signed)
[facility]

## 2014-03-24 NOTE — ED Notes (Signed)
EKG will be done once [facility]

## 2014-03-24 NOTE — ED Notes (Addendum)
Pt laying in supine position upon assessment. Pt repositioned sitting in semi fowlers position with bed slanted backwards and feet elevated. Kohut MD aware.

## 2014-03-24 NOTE — ED Notes (Signed)
Pt states on Friday abscess started in lower left mouth. Pt does have broken tooth on that side. Pt now has swelling and pain to entire left side of face.
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
## 2014-03-24 NOTE — ED Notes (Signed)
[facility].

## 2014-03-24 NOTE — ED Notes (Addendum)
As pt rest oxygen saturation on RA high 80s to low 90s. 2 lpm [place] applied. Kohut MD aware.

## 2014-03-24 NOTE — ED Notes (Addendum)
Kohut MD aware of trending BP readings. Order for 100 ml/hr sodium chloride infusion. Pt has had total of 2000 ml of sodium chloride and 50 ml of antibiotic prior to 100 ml/hr sodium chloride infusion start.

## 2014-03-24 NOTE — Plan of Care (Signed)
Problem: Phase I Progression Outcomes Goal: Hemodynamically stable Outcome: Progressing BPs still soft

## 2014-03-25 ENCOUNTER — Inpatient Hospital Stay (HOSPITAL_COMMUNITY): Payer: BC Managed Care – PPO

## 2014-03-25 LAB — COMPREHENSIVE METABOLIC PANEL
ALBUMIN: 2.8 g/dL — AB (ref 3.5–5.2)
ALT: 49 U/L (ref 0–53)
ANION GAP: 13 (ref 5–15)
AST: 60 U/L — ABNORMAL HIGH (ref 0–37)
Alkaline Phosphatase: 67 U/L (ref 39–117)
BUN: 15 mg/dL (ref 6–23)
CO2: 19 mEq/L (ref 19–32)
CREATININE: 1.14 mg/dL (ref 0.50–1.35)
Calcium: 7.9 mg/dL — ABNORMAL LOW (ref 8.4–10.5)
Chloride: 106 mEq/L (ref 96–112)
GFR calc Af Amer: >90 mL/min (ref >90)
GFR calc non Af Amer: 78 mL/min — ABNORMAL LOW (ref >90)
Glucose, Bld: 78 mg/dL (ref 70–99)
Potassium: 4.1 mEq/L (ref 3.7–5.3)
Sodium: 138 mEq/L (ref 137–147)
TOTAL PROTEIN: 6 g/dL (ref 6.0–8.3)
Total Bilirubin: 0.7 mg/dL (ref 0.3–1.2)

## 2014-03-25 LAB — RAPID URINE DRUG SCREEN, HOSP PERFORMED
AMPHETAMINES: NOT DETECTED
BARBITURATES: NOT DETECTED
BENZODIAZEPINES: NOT DETECTED
Cocaine: NOT DETECTED
Opiates: NOT DETECTED
TETRAHYDROCANNABINOL: NOT DETECTED

## 2014-03-25 LAB — CBC
HCT: 30.4 % — ABNORMAL LOW (ref 39.0–52.0)
Hemoglobin: 10.3 g/dL — ABNORMAL LOW (ref 13.0–17.0)
MCH: 29.1 pg (ref 26.0–34.0)
MCHC: 33.9 g/dL (ref 30.0–36.0)
MCV: 85.9 fL (ref 78.0–100.0)
Platelets: 168 10*3/uL (ref 150–400)
RBC: 3.54 MIL/uL — ABNORMAL LOW (ref 4.22–5.81)
RDW: 13.2 % (ref 11.5–15.5)
WBC: 9.8 10*3/uL (ref 4.0–10.5)

## 2014-03-25 LAB — SODIUM, URINE, RANDOM: Sodium, Ur: 55 mEq/L

## 2014-03-25 LAB — URINALYSIS, ROUTINE W REFLEX MICROSCOPIC
BILIRUBIN URINE: NEGATIVE
Glucose, UA: NEGATIVE mg/dL
Hgb urine dipstick: NEGATIVE
KETONES UR: NEGATIVE mg/dL
Leukocytes, UA: NEGATIVE
NITRITE: NEGATIVE
PROTEIN: NEGATIVE mg/dL
Specific Gravity, Urine: 1.028 (ref 1.005–1.030)
Urobilinogen, UA: 1 mg/dL (ref 0.0–1.0)
pH: 5 (ref 5.0–8.0)

## 2014-03-25 LAB — TROPONIN I
Troponin I: <0.3 ng/mL (ref <0.30)
Troponin I: <0.3 ng/mL (ref <0.30)

## 2014-03-25 LAB — LACTIC ACID, PLASMA: Lactic Acid, Venous: 0.6 mmol/L (ref 0.5–2.2)

## 2014-03-25 LAB — STREP PNEUMONIAE URINARY ANTIGEN: Strep Pneumo Urinary Antigen: NEGATIVE

## 2014-03-25 LAB — GLUCOSE, CAPILLARY: Glucose-Capillary: 80 mg/dL (ref 70–99)

## 2014-03-25 LAB — CREATININE, URINE, RANDOM: Creatinine, Urine: 119.5 mg/dL

## 2014-03-25 MED ORDER — METHYLPREDNISOLONE SODIUM SUCC 40 MG IJ SOLR
40.0000 mg | Freq: Two times a day (BID) | INTRAMUSCULAR | Status: AC
  Administered 2014-03-25 (×2): 40 mg via INTRAVENOUS
  Filled 2014-03-25 (×2): qty 1

## 2014-03-25 MED ORDER — PIPERACILLIN-TAZOBACTAM 3.375 G IVPB
3.3750 g | Freq: Three times a day (TID) | INTRAVENOUS | Status: DC
  Administered 2014-03-25 – 2014-03-27 (×6): 3.375 g via INTRAVENOUS
  Filled 2014-03-25 (×8): qty 50

## 2014-03-25 NOTE — Care Management Note (Signed)
    Page 1 of 1   03/25/2014     12:49:17 PM CARE MANAGEMENT NOTE 03/25/2014  Patient:  Bryan Blake,Bryan Blake   Account Number:  [id number]  Date Initiated:  03/25/2014  Documentation initiated by:  Lorenda IshiharaPEELE,Constancia Geeting  Subjective/Objective Assessment:   43 yo male admitted with facial swelling, dental abscess, PNA. PTA lived at home with spouse.     Action/Plan:   Home when stable   Anticipated DC Date:  03/28/2014   Anticipated DC Plan:  HOME/SELF CARE      DC Planning Services  CM consult      Choice offered to / List presented to:             Status of service:  Completed, signed off Medicare Important Message given?   (If response is "NO", the following Medicare IM given date fields will be blank) Date Medicare IM given:   Medicare IM given by:   Date Additional Medicare IM given:   Additional Medicare IM given by:    Discharge Disposition:  HOME/SELF CARE  Per UR Regulation:  Reviewed for med. necessity/level of care/duration of stay  If discussed at Long Length of Stay Meetings, dates discussed:    Comments:
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
## 2014-03-26 LAB — LEGIONELLA ANTIGEN, URINE

## 2014-03-26 LAB — GLUCOSE, CAPILLARY: Glucose-Capillary: 117 mg/dL — ABNORMAL HIGH (ref 70–99)

## 2014-03-26 MED ORDER — METHYLPREDNISOLONE SODIUM SUCC 40 MG IJ SOLR
40.0000 mg | Freq: Once | INTRAMUSCULAR | Status: AC
  Administered 2014-03-26: 40 mg via INTRAVENOUS
  Filled 2014-03-26: qty 1
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
## 2014-03-27 LAB — GLUCOSE, CAPILLARY: Glucose-Capillary: 101 mg/dL — ABNORMAL HIGH (ref 70–99)

## 2014-03-27 MED ORDER — NICOTINE 14 MG/24HR TD PT24: 14.0000 mg | MEDICATED_PATCH | Freq: Every day | TRANSDERMAL | Status: DC

## 2014-03-27 MED ORDER — AMOXICILLIN-POT CLAVULANATE 875-125 MG PO TABS: 1.0000 | ORAL_TABLET | Freq: Two times a day (BID) | ORAL | Status: AC

## 2014-03-27 MED ORDER — HYDROCODONE-ACETAMINOPHEN 5-325 MG PO TABS: 2.0000 | ORAL_TABLET | ORAL | Status: DC | PRN

## 2014-03-27 NOTE — Progress Notes (Signed)
Talked to patient about a PCP; [facility] and plans to make a follow up hosp apt with them until he decides on another PCP to go to; B Fort [employer]handler RN,BSN,MHA [phone]
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
## 2014-03-27 NOTE — Discharge Instructions (Signed)
Patient can go back to work tomorrow 12/18

## 2014-03-27 NOTE — Progress Notes (Signed)
Patient discharge to home, wife at bedside. D/c instructions given to patient verbalized understanding. PIV removed no s/s of infiltration or swelling noted on insertion site.

## 2014-03-28 NOTE — Treatment Plan (Signed)
       Bryan Blake  DOB/AGE: 41/05/1970 41 y.o.   Patient hospitalize 12/14-12/17  Can go back to work  12/21  Please call [phone] if any questions    Signed: [facility],Bryan Blake 03/27/2014, 12:45 PM

## 2014-03-30 LAB — CULTURE, BLOOD (SINGLE): Culture: NO GROWTH
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
## 2015-03-29 ENCOUNTER — Encounter (HOSPITAL_COMMUNITY): Payer: Self-pay | Admitting: Emergency Medicine

## 2015-03-29 ENCOUNTER — Emergency Department (HOSPITAL_COMMUNITY)
Admission: EM | Admit: 2015-03-29 | Discharge: 2015-03-29 | Disposition: A | Discharge status: Home / Self Care | Payer: BLUE CROSS/BLUE SHIELD | Attending: Emergency Medicine | Admitting: Emergency Medicine

## 2015-03-29 DIAGNOSIS — H00016 Hordeolum externum left eye, unspecified eyelid: Secondary | ICD-10-CM

## 2015-03-29 DIAGNOSIS — Z8709 Personal history of other diseases of the respiratory system: Secondary | ICD-10-CM | POA: Diagnosis not present

## 2015-03-29 DIAGNOSIS — Z87828 Personal history of other (healed) physical injury and trauma: Secondary | ICD-10-CM | POA: Insufficient documentation

## 2015-03-29 DIAGNOSIS — Z79899 Other long term (current) drug therapy: Secondary | ICD-10-CM | POA: Diagnosis not present

## 2015-03-29 DIAGNOSIS — L03213 Periorbital cellulitis: Secondary | ICD-10-CM | POA: Diagnosis not present

## 2015-03-29 DIAGNOSIS — H00014 Hordeolum externum left upper eyelid: Secondary | ICD-10-CM | POA: Diagnosis present

## 2015-03-29 DIAGNOSIS — F17210 Nicotine dependence, cigarettes, uncomplicated: Secondary | ICD-10-CM | POA: Diagnosis not present

## 2015-03-29 MED ORDER — SULFAMETHOXAZOLE-TRIMETHOPRIM 800-160 MG PO TABS: 1.0000 | ORAL_TABLET | Freq: Two times a day (BID) | ORAL | Status: DC

## 2015-03-29 MED ORDER — CEPHALEXIN 500 MG PO CAPS: 500.0000 mg | ORAL_CAPSULE | Freq: Four times a day (QID) | ORAL | Status: DC

## 2015-03-29 NOTE — ED Notes (Signed)
Pt from home c/o stye to the left eye a few days. Left upper eyelid appears swollen and red.
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
## 2015-03-29 NOTE — Discharge Instructions (Signed)
Read the information below.  Use the prescribed medication as directed.  Please discuss all new medications with your pharmacist.  You may return to the Emergency Department at any time for worsening condition or any new symptoms that concern you.     If you develop worsening pain in your eye, change in your vision, swelling around your eye, difficulty moving your eye, or fevers greater than 100.4, see your eye doctor or return to the Emergency Department immediately for a recheck.       Stye A stye is a bump on your eyelid caused by a bacterial infection. A stye can form inside the eyelid (internal stye) or outside the eyelid (external stye). An internal stye may be caused by an infected oil-producing gland inside your eyelid. An external stye may be caused by an infection at the base of your eyelash (hair follicle). Styes are very common. Anyone can get them at any age. They usually occur in just one eye, but you may have more than one in either eye.  CAUSES  The infection is almost always caused by bacteria called Staphylococcus aureus. This is a common type of bacteria that lives on your skin. RISK FACTORS You may be at higher risk for a stye if you have had one before. You may also be at higher risk if you have:  Diabetes.  Long-term illness.  Long-term eye redness.  A skin condition called seborrhea.  High fat levels in your blood (lipids). SIGNS AND SYMPTOMS  Eyelid pain is the most common symptom of a stye. Internal styes are more painful than external styes. Other signs and symptoms may include:  Painful swelling of your eyelid.  A scratchy feeling in your eye.  Tearing and redness of your eye.  Pus draining from the stye. DIAGNOSIS  Your health care provider may be able to diagnose a stye just by examining your eye. The health care provider may also check to make sure:  You do not have a fever or other signs of a more serious infection.  The infection has not spread to  other parts of your eye or areas around your eye. TREATMENT  Most styes will clear up in a few days without treatment. In some cases, you may need to use antibiotic drops or ointment to prevent infection. Your health care provider may have to drain the stye surgically if your stye is:  Large.  Causing a lot of pain.  Interfering with your vision. This can be done using a thin blade or a needle.  HOME CARE INSTRUCTIONS   Take medicines only as directed by your health care provider.  Apply a clean, warm compress to your eye for 10 minutes, 4 times a day.  Do not wear contact lenses or eye makeup until your stye has healed.  Do not try to pop or drain the stye. SEEK MEDICAL CARE IF:  You have chills or a fever.  Your stye does not go away after several days.  Your stye affects your vision.  Your eyeball becomes swollen, red, or painful. MAKE SURE YOU:  Understand these instructions.  Will watch your condition.  Will get help right away if you are not doing well or get worse.   This information is not intended to replace advice given to you by your health care provider. Make sure you discuss any questions you have with your health care provider.   Document Released: 01/05/2005 Document Revised: 04/18/2014 Document Reviewed: 07/12/2013 Elsevier Interactive Patient Education  2016 [employer] ° °

## 2015-12-15 ENCOUNTER — Emergency Department (HOSPITAL_COMMUNITY): Payer: BLUE CROSS/BLUE SHIELD

## 2015-12-15 ENCOUNTER — Emergency Department (HOSPITAL_COMMUNITY)
Admission: EM | Admit: 2015-12-15 | Discharge: 2015-12-15 | Disposition: A | Discharge status: Home / Self Care | Payer: BLUE CROSS/BLUE SHIELD | Attending: Emergency Medicine | Admitting: Emergency Medicine

## 2015-12-15 ENCOUNTER — Encounter (HOSPITAL_COMMUNITY): Payer: Self-pay | Admitting: Emergency Medicine

## 2015-12-15 DIAGNOSIS — R079 Chest pain, unspecified: Secondary | ICD-10-CM | POA: Diagnosis not present

## 2015-12-15 DIAGNOSIS — R0602 Shortness of breath: Secondary | ICD-10-CM | POA: Diagnosis not present

## 2015-12-15 DIAGNOSIS — R0789 Other chest pain: Secondary | ICD-10-CM

## 2015-12-15 DIAGNOSIS — F17210 Nicotine dependence, cigarettes, uncomplicated: Secondary | ICD-10-CM | POA: Insufficient documentation

## 2015-12-15 DIAGNOSIS — K219 Gastro-esophageal reflux disease without esophagitis: Secondary | ICD-10-CM | POA: Diagnosis not present

## 2015-12-15 DIAGNOSIS — Z791 Long term (current) use of non-steroidal anti-inflammatories (NSAID): Secondary | ICD-10-CM | POA: Diagnosis not present

## 2015-12-15 DIAGNOSIS — D649 Anemia, unspecified: Secondary | ICD-10-CM | POA: Insufficient documentation

## 2015-12-15 DIAGNOSIS — R072 Precordial pain: Secondary | ICD-10-CM | POA: Diagnosis not present

## 2015-12-15 DIAGNOSIS — K2970 Gastritis, unspecified, without bleeding: Secondary | ICD-10-CM | POA: Diagnosis not present

## 2015-12-15 DIAGNOSIS — Z720 Tobacco use: Secondary | ICD-10-CM

## 2015-12-15 LAB — BASIC METABOLIC PANEL
ANION GAP: 5 (ref 5–15)
BUN: 13 mg/dL (ref 6–20)
CALCIUM: 9.5 mg/dL (ref 8.9–10.3)
CO2: 27 mmol/L (ref 22–32)
Chloride: 107 mmol/L (ref 101–111)
Creatinine, Ser: 1.05 mg/dL (ref 0.61–1.24)
GFR calc non Af Amer: >60 mL/min (ref >60)
Glucose, Bld: 107 mg/dL — ABNORMAL HIGH (ref 65–99)
Potassium: 3.9 mmol/L (ref 3.5–5.1)
Sodium: 139 mmol/L (ref 135–145)

## 2015-12-15 LAB — D-DIMER, QUANTITATIVE (NOT AT ARMC): D DIMER QUANT: 0.38 ug{FEU}/mL (ref 0.00–0.50)

## 2015-12-15 LAB — HEPATIC FUNCTION PANEL
ALT: 19 U/L (ref 17–63)
AST: 23 U/L (ref 15–41)
Albumin: 4.2 g/dL (ref 3.5–5.0)
Alkaline Phosphatase: 59 U/L (ref 38–126)
BILIRUBIN INDIRECT: 0.4 mg/dL (ref 0.3–0.9)
Bilirubin, Direct: 0.1 mg/dL (ref 0.1–0.5)
TOTAL PROTEIN: 7.4 g/dL (ref 6.5–8.1)
Total Bilirubin: 0.5 mg/dL (ref 0.3–1.2)

## 2015-12-15 LAB — LIPASE, BLOOD: LIPASE: 37 U/L (ref 11–51)

## 2015-12-15 LAB — I-STAT TROPONIN, ED
TROPONIN I, POC: 0 ng/mL (ref 0.00–0.08)
TROPONIN I, POC: 0 ng/mL (ref 0.00–0.08)

## 2015-12-15 LAB — CBC
HCT: 35 % — ABNORMAL LOW (ref 39.0–52.0)
HEMOGLOBIN: 12.4 g/dL — AB (ref 13.0–17.0)
MCH: 29.1 pg (ref 26.0–34.0)
MCHC: 35.4 g/dL (ref 30.0–36.0)
MCV: 82.2 fL (ref 78.0–100.0)
Platelets: 190 10*3/uL (ref 150–400)
RBC: 4.26 MIL/uL (ref 4.22–5.81)
RDW: 13.1 % (ref 11.5–15.5)
WBC: 8.6 10*3/uL (ref 4.0–10.5)

## 2015-12-15 MED ORDER — GI COCKTAIL ~~LOC~~
30.0000 mL | Freq: Once | ORAL | Status: AC
  Administered 2015-12-15: 30 mL via ORAL
  Filled 2015-12-15: qty 30

## 2015-12-15 MED ORDER — ASPIRIN 81 MG PO CHEW
324.0000 mg | CHEWABLE_TABLET | Freq: Once | ORAL | Status: AC
  Administered 2015-12-15: 324 mg via ORAL
  Filled 2015-12-15: qty 4

## 2015-12-15 NOTE — Discharge Instructions (Signed)
Your labs, xray, and EKG are all reassuring, your chest pain could be a variety of things including gas pain, muscle pain, indigestion, and other nonemergent issues. Alternate between tylenol and motrin  as directed as needed for pain. Stay well hydrated. You may consider using heat to the areas of pain, no more than 20 minutes every hour. May want to consider using over-the-counter tums/maalox/zantac to help with symptom relief. Avoid fatty/fried/acidic/citric foods, avoid laying down within 30 minutes of eating a meal, and avoid NSAIDs like ibuprofen/aleve/etc on an empty stomach. STOP SMOKING! Follow up with your regular doctor in 1 week for recheck of symptoms. Return to the ER for changes or worsening symptoms.  SEEK IMMEDIATE MEDICAL ATTENTION IF: You develop a fever.  Your chest pains become severe or intolerable.  You develop new, unexplained symptoms (problems).  You develop shortness of breath, nausea, vomiting, sweating or feel light headed.  You develop a new cough or you cough up blood. You develop new leg swelling

## 2015-12-15 NOTE — ED Triage Notes (Signed)
Patient reports that he was at work and he began to have central chest pain about 30-45 minutes. Denies shortness of breath, cough, nausea, or vomiting.

## 2015-12-15 NOTE — ED Notes (Signed)
Pt left room  Before discharge instruction . Pt I s nowhere to be found near department.
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
## 2017-03-24 ENCOUNTER — Emergency Department (HOSPITAL_COMMUNITY)
Admission: EM | Admit: 2017-03-24 | Discharge: 2017-03-24 | Disposition: A | Discharge status: Home / Self Care | Payer: BLUE CROSS/BLUE SHIELD | Attending: Emergency Medicine | Admitting: Emergency Medicine

## 2017-03-24 ENCOUNTER — Other Ambulatory Visit: Payer: Self-pay

## 2017-03-24 ENCOUNTER — Encounter (HOSPITAL_COMMUNITY): Payer: Self-pay

## 2017-03-24 DIAGNOSIS — Z5321 Procedure and treatment not carried out due to patient leaving prior to being seen by health care provider: Secondary | ICD-10-CM | POA: Insufficient documentation

## 2017-03-24 DIAGNOSIS — R109 Unspecified abdominal pain: Secondary | ICD-10-CM | POA: Insufficient documentation

## 2017-03-24 LAB — CBC
HCT: 33.7 % — ABNORMAL LOW (ref 39.0–52.0)
HEMOGLOBIN: 11.8 g/dL — AB (ref 13.0–17.0)
MCH: 30 pg (ref 26.0–34.0)
MCHC: 35 g/dL (ref 30.0–36.0)
MCV: 85.8 fL (ref 78.0–100.0)
Platelets: 177 10*3/uL (ref 150–400)
RBC: 3.93 MIL/uL — ABNORMAL LOW (ref 4.22–5.81)
RDW: 13.4 % (ref 11.5–15.5)
WBC: 9.5 10*3/uL (ref 4.0–10.5)

## 2017-03-24 LAB — URINALYSIS, ROUTINE W REFLEX MICROSCOPIC
BACTERIA UA: NONE SEEN
Bilirubin Urine: NEGATIVE
Glucose, UA: NEGATIVE mg/dL
KETONES UR: NEGATIVE mg/dL
Leukocytes, UA: NEGATIVE
NITRITE: NEGATIVE
PH: 5 (ref 5.0–8.0)
PROTEIN: NEGATIVE mg/dL
Specific Gravity, Urine: 1.02 (ref 1.005–1.030)

## 2017-03-24 LAB — LIPASE, BLOOD: Lipase: 29 U/L (ref 11–51)

## 2017-03-24 LAB — COMPREHENSIVE METABOLIC PANEL
ALBUMIN: 4.6 g/dL (ref 3.5–5.0)
ALK PHOS: 74 U/L (ref 38–126)
ALT: 40 U/L (ref 17–63)
ANION GAP: 9 (ref 5–15)
AST: 28 U/L (ref 15–41)
BILIRUBIN TOTAL: 0.5 mg/dL (ref 0.3–1.2)
BUN: 15 mg/dL (ref 6–20)
CALCIUM: 9.6 mg/dL (ref 8.9–10.3)
CO2: 23 mmol/L (ref 22–32)
Chloride: 109 mmol/L (ref 101–111)
Creatinine, Ser: 0.96 mg/dL (ref 0.61–1.24)
GFR calc Af Amer: >60 mL/min (ref >60)
GFR calc non Af Amer: >60 mL/min (ref >60)
GLUCOSE: 95 mg/dL (ref 65–99)
Potassium: 4 mmol/L (ref 3.5–5.1)
SODIUM: 141 mmol/L (ref 135–145)
TOTAL PROTEIN: 7.9 g/dL (ref 6.5–8.1)

## 2017-03-24 MED ORDER — ONDANSETRON 4 MG PO TBDP
4.0000 mg | ORAL_TABLET | Freq: Once | ORAL | Status: AC | PRN
  Administered 2017-03-24: 4 mg via ORAL
  Filled 2017-03-24: qty 1

## 2017-03-24 NOTE — ED Notes (Signed)
Pt is aware a urine sample is needed, but is unable to provide one at this time. A specimen cup was provided for Pt.

## 2017-03-24 NOTE — ED Notes (Signed)
Pt called from the lobby with no response 

## 2017-03-24 NOTE — ED Triage Notes (Signed)
Called to treatment area. No reponse

## 2017-03-24 NOTE — ED Triage Notes (Signed)
Patient c/o left mid and left lower abdominal pain and vomiting since 0600.

## 2017-07-11 ENCOUNTER — Ambulatory Visit: Payer: BLUE CROSS/BLUE SHIELD | Admitting: Internal Medicine

## 2017-07-11 ENCOUNTER — Other Ambulatory Visit: Payer: Self-pay

## 2017-07-11 ENCOUNTER — Ambulatory Visit (INDEPENDENT_AMBULATORY_CARE_PROVIDER_SITE_OTHER): Payer: BLUE CROSS/BLUE SHIELD | Admitting: Internal Medicine

## 2017-07-11 ENCOUNTER — Encounter: Payer: Self-pay | Admitting: Internal Medicine

## 2017-07-11 VITALS — BP 100/70 | HR 74 | Temp 98.4°F | Ht 65.0 in | Wt 166.2 lb

## 2017-07-11 DIAGNOSIS — Z720 Tobacco use: Secondary | ICD-10-CM | POA: Diagnosis not present

## 2017-07-11 DIAGNOSIS — R51 Headache: Secondary | ICD-10-CM | POA: Diagnosis not present

## 2017-07-11 DIAGNOSIS — R519 Headache, unspecified: Secondary | ICD-10-CM

## 2017-07-11 MED ORDER — SUMATRIPTAN SUCCINATE 50 MG PO TABS: 50.0000 mg | ORAL_TABLET | ORAL | 0 refills | Status: DC | PRN
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
## 2017-07-11 NOTE — Assessment & Plan Note (Addendum)
Headaches are most likely daily migraines with variable intensity given the unilateral location and associated photophobia and phonophobia.  -Can continue to take Tylenol as needed for less severe headaches -Sumatriptan 50 mg prescribed to use for severe headaches that are unresponsive to Tylenol; counseled to not exceed two doses in one day  -Counseled patient on staying hydrated, particularly as weather gets warmer, eating regularly throughout the day, and sleeping 7-8 hours a night to help lessen frequency of headaches -Patient asked to complete a headache diary and bring to next visit -if headaches become more severe/frequent, would consider use of prophylactic medication such as Propanolol

## 2017-07-11 NOTE — Patient Instructions (Addendum)
I have prescribed Sumatriptan for your headaches. You should only use this for a severe headache. Do not take more than two doses in a day. Please return in 1 month with your headache diary.   There are 3 lifestyle behaviors that are important to minimize headaches.  You should sleep 7-8 hours at night time.  Bedtime should be a set time for going to bed and waking up with few exceptions.  You need to drink about 64 ounces of water per day, more on days when you are out in the heat.  You may need to flavor the water so that you will be more likely to drink it.  Do not use Kool-Aid or other sugar drinks because they add empty calories and actually increase urine output.  You need to eat 3 meals per day.  You should not skip meals.  The meal does not have to be a big one.  Make daily entries into the headache calendar.

## 2017-07-12 ENCOUNTER — Telehealth: Payer: Self-pay

## 2017-07-12 NOTE — Telephone Encounter (Signed)
-----   Message from Catherine Lauren Wallace, DO sent at 07/12/2017  1:28 PM EDT ----- I have completed FMLA paperwork for Mr. Rogoff. It will be available for pick up this afternoon at the front desk. Please call him to let him know.   Catherine Wallace, D.O. 07/12/2017, 1:29 PM PGY-3, [facility] 

## 2017-07-12 NOTE — Telephone Encounter (Signed)
-----   Message from Arvilla Marketatherine Lauren Wallace, DO sent at 07/12/2017  1:28 PM EDT ----- I have completed FMLA paperwork for Mr. Betker. It will be available for pick up this afternoon at the front desk. Please call him to let him know.   Marcy Sirenatherine Wallace, D.O. 07/12/2017, 1:29 PM PGY-3, [facility]

## 2017-07-12 NOTE — Telephone Encounter (Signed)
LVM on home and mobile numbers asking for a return call. If pt calls, please give him the information below. Sunday SpillersSharon T Saunders, CMA

## 2017-07-13 NOTE — Telephone Encounter (Signed)
LVM on home and mobile for pt to call back to inform him of below. Bryan Blake, Bryan Blake D, [place]

## 2017-07-17 NOTE — Telephone Encounter (Signed)
Lisa- "HR" called regarding patients FMLA paperwork. States she wanted to speak with MD about his FMLA paperwork- needs to verify with her. Her call back [phone] Shawna OrleansMeredith B Thomsen, RN

## 2017-07-18 NOTE — Telephone Encounter (Signed)
Lisette- HR manager called again about his FMLA paperwork. She states a lot of the paperwork was left blank including the prescriber information. Asked her to fax paperwork back to us and will forward to MD for review.  Will be faxing back his FMLA papers. Shawna OrleansMeredith B Thomsen, RN

## 2017-07-18 NOTE — Telephone Encounter (Signed)
LVM for pt to call back to give him the below information. Bryan Blake, Bryan Blake, [place]

## 2017-07-18 NOTE — Telephone Encounter (Signed)
Have completed patient's FMLA paperwork. Please ask patient to return to pick up paperwork from the front office.   Marcy Sirenatherine Wallace, D.O. 07/18/2017, 4:23 PM PGY-3, [facility]

## 2017-07-21 NOTE — Telephone Encounter (Signed)
Checked up front to see if form has been picked up and it appears that it has been picked up but pt did not sign for it in the book.   Lamonte SakaiZimmerman Rumple, April D, [place]

## 2017-08-17 ENCOUNTER — Ambulatory Visit: Payer: BLUE CROSS/BLUE SHIELD | Admitting: Internal Medicine

## 2017-08-18 ENCOUNTER — Telehealth: Payer: Self-pay | Admitting: Internal Medicine

## 2017-08-18 NOTE — Telephone Encounter (Signed)
On the initial paperwork for FMLA, dr wallace showed headaches. It needs to show migraines.  New FMLA was left up front 08-17-17. Pt has an appt with Earlene Plater on Thur 08-24-17 but needs the paperwork before then. Please advise.

## 2017-08-18 NOTE — Telephone Encounter (Signed)
FMLA papers are in Dr. Philis Pique box. Sunday Spillers, CMA

## 2017-08-22 NOTE — Telephone Encounter (Signed)
I have re-done the first page of his FMLA paperwork to show migraines.   Marcy Siren, D.O. 08/22/2017, 1:38 PM PGY-3, [facility]

## 2017-08-22 NOTE — Telephone Encounter (Signed)
Pt left message on nurse line checking status of FMLA Paperwork, wanting to know if it has been faxed to his HR dept yet? Call back number [phone] Shawna Orleans, RN

## 2017-08-24 ENCOUNTER — Ambulatory Visit: Payer: BLUE CROSS/BLUE SHIELD | Admitting: Internal Medicine

## 2017-09-06 NOTE — Telephone Encounter (Signed)
HR department for pt calling- requesting a call back, states they need some clarification on his FMLA paperwork. Did not leave a name just to return call to HR. [phone] Shawna Orleans, RN

## 2017-09-15 NOTE — Telephone Encounter (Signed)
Returned phone call. No answer so left VM asking to call back to discuss FMLA paperwork.   Marcy Sirenatherine Bijal Siglin, D.O. 09/15/2017, 1:00 PM PGY-3, [facility]

## 2018-03-22 ENCOUNTER — Encounter (HOSPITAL_COMMUNITY): Payer: Self-pay | Admitting: Emergency Medicine

## 2018-03-22 ENCOUNTER — Emergency Department (HOSPITAL_COMMUNITY)
Admission: EM | Admit: 2018-03-22 | Discharge: 2018-03-22 | Disposition: A | Discharge status: Home / Self Care | Payer: BLUE CROSS/BLUE SHIELD | Attending: Emergency Medicine | Admitting: Emergency Medicine

## 2018-03-22 DIAGNOSIS — K0889 Other specified disorders of teeth and supporting structures: Secondary | ICD-10-CM | POA: Insufficient documentation

## 2018-03-22 DIAGNOSIS — Z79899 Other long term (current) drug therapy: Secondary | ICD-10-CM | POA: Insufficient documentation

## 2018-03-22 DIAGNOSIS — F17210 Nicotine dependence, cigarettes, uncomplicated: Secondary | ICD-10-CM | POA: Insufficient documentation

## 2018-03-22 MED ORDER — CLINDAMYCIN HCL 150 MG PO CAPS: 450.0000 mg | ORAL_CAPSULE | Freq: Three times a day (TID) | ORAL | 0 refills | Status: DC

## 2018-03-22 MED ORDER — NAPROXEN 500 MG PO TABS: 500.0000 mg | ORAL_TABLET | Freq: Two times a day (BID) | ORAL | 0 refills | Status: AC

## 2018-03-22 NOTE — Discharge Instructions (Signed)
Call one of the dentists offices provided to schedule an appointment for re-evaluation and further management within the next 48 hours.   I have prescribed you Clindamycin which is an antibiotic to treat the infection and Naproxen which is an anti-inflammatory medicine to treat the pain.   Please take all of your antibiotics until finished. You may develop abdominal discomfort or diarrhea from the antibiotic.  You may help offset this with probiotics which you can buy at the store (ask your pharmacist if unable to find) or get probiotics in the form of eating yogurt. Do not eat or take the probiotics until 2 hours after your antibiotic. If you are unable to tolerate these side effects follow-up with your primary care provider or return to the emergency department.   If you begin to experience any blistering, rashes, swelling, or difficulty breathing seek medical care for evaluation of potentially more serious side effects.   Be sure to eat something when taking the Naproxen as it can cause stomach upset and at worst stomach bleeding. Do not take additional non steroidal anti-inflammatory medicines such as Ibuprofen, Aleve, Advil, Mobic, Diclofenac, or goodie powder while taking Naproxen. You may supplement with Tylenol.   We have prescribed you new medication(s) today. Discuss the medications prescribed today with your pharmacist as they can have adverse effects and interactions with your other medicines including over the counter and prescribed medications. Seek medical evaluation if you start to experience new or abnormal symptoms after taking one of these medicines, seek care immediately if you start to experience difficulty breathing, feeling of your throat closing, facial swelling, or rash as these could be indications of a more serious allergic reaction  If you start to experience and new or worsening symptoms return to the emergency department. If you start to experience fever, chills, neck  stiffness/pain, or inability to move your neck or open your mouth come back to the emergency department immediately.   
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
## 2018-04-12 ENCOUNTER — Encounter: Payer: Self-pay | Admitting: Family Medicine

## 2018-04-12 ENCOUNTER — Ambulatory Visit (INDEPENDENT_AMBULATORY_CARE_PROVIDER_SITE_OTHER): Payer: BLUE CROSS/BLUE SHIELD | Admitting: Family Medicine

## 2018-04-12 ENCOUNTER — Other Ambulatory Visit: Payer: Self-pay

## 2018-04-12 VITALS — BP 103/70 | HR 85 | Temp 98.5°F | Ht 65.0 in | Wt 163.0 lb

## 2018-04-12 DIAGNOSIS — E782 Mixed hyperlipidemia: Secondary | ICD-10-CM

## 2018-04-12 DIAGNOSIS — R51 Headache: Principal | ICD-10-CM

## 2018-04-12 DIAGNOSIS — Z23 Encounter for immunization: Secondary | ICD-10-CM

## 2018-04-12 DIAGNOSIS — Z113 Encounter for screening for infections with a predominantly sexual mode of transmission: Secondary | ICD-10-CM

## 2018-04-12 DIAGNOSIS — F528 Other sexual dysfunction not due to a substance or known physiological condition: Secondary | ICD-10-CM

## 2018-04-12 DIAGNOSIS — R5382 Chronic fatigue, unspecified: Secondary | ICD-10-CM | POA: Diagnosis not present

## 2018-04-12 DIAGNOSIS — R519 Headache, unspecified: Secondary | ICD-10-CM

## 2018-04-12 DIAGNOSIS — R739 Hyperglycemia, unspecified: Secondary | ICD-10-CM

## 2018-04-12 LAB — POCT GLYCOSYLATED HEMOGLOBIN (HGB A1C): Hemoglobin A1C: 5 % (ref 4.0–5.6)

## 2018-04-12 MED ORDER — SUMATRIPTAN SUCCINATE 50 MG PO TABS: 50.0000 mg | ORAL_TABLET | Freq: Every day | ORAL | 0 refills | Status: AC | PRN

## 2018-04-12 MED ORDER — SUMATRIPTAN SUCCINATE 50 MG PO TABS: 50.0000 mg | ORAL_TABLET | Freq: Every day | ORAL | 0 refills | Status: DC | PRN

## 2018-04-12 NOTE — Progress Notes (Signed)
Subjective:  Bryan Blake is a 48 y.o. male who [facility] today with a chief complaint of erectile dysfunction.   HPI: Patient complains of erectile dysfunction.  He says this is been going on for over a year.  He said that he is still interested but has trouble obtaining an erection.  He wants to know if this could be a testosterone problem and wanted testosterone testing.  We did discuss vascular history and he says he has no known vascular problems.  He also request a physical and food sensitivity testing as well as "all the labs ".  He says that he believes his insurance should cover it but he wants to know what is going on and wants a full work-up.  He also says that he does not feel he has as much energy as before when he was younger.  He denies any shortness of breath or specific exercise limitations says that he could walk up 6 flights of stairs with no difficulty but just does not feel he has the the energy when he was younger.  He denies current substance use or depressive symptoms.  He does have occasional migraines, approximately 2/month.  He takes a triptan and generally sleeps most the day and finds that he is okay by the next day.  He declines preventative medication for this.  Objective:  Physical Exam: BP 103/70   Pulse 85   Temp 98.5 F (36.9 C) (Oral)   Ht 5\' 5"  (1.651 m)   Wt 163 lb (73.9 kg)   SpO2 99%   BMI 27.12 kg/m   Gen: NAD, resting comfortably CV: RRR with no murmurs appreciated Pulm: NWOB, CTAB with no crackles, wheezes, or rhonchi GI: Normal bowel sounds present. Soft, Nontender, Nondistended. MSK: no edema, cyanosis, or clubbing noted Skin: warm, dry Neuro: grossly normal, moves all extremities Psych: Normal affect and thought content  Results for orders placed or performed in visit on 04/12/18 (from the past 72 hour(s))  TSH     Status: None   Collection Time: 04/12/18  2:55 PM  Result Value Ref Range   TSH 0.895 0.450 - 4.500 uIU/mL    Lipid Panel     Status: Abnormal   Collection Time: 04/12/18  2:55 PM  Result Value Ref Range   Cholesterol, Total 240 (H) 100 - 199 mg/dL   Triglycerides 409381 (H) 0 - 149 mg/dL   HDL 34 (L) >81>39 mg/dL   VLDL Cholesterol Cal 76 (H) 5 - 40 mg/dL   LDL Calculated 191130 (H) 0 - 99 mg/dL   Chol/HDL Ratio 7.1 (H) 0.0 - 5.0 ratio    Comment:                                   T. Chol/HDL Ratio                                             Men  Women                               1/2 Avg.Risk  3.4    3.3  Avg.Risk  5.0    4.4                                2X Avg.Risk  9.6    7.1                                3X Avg.Risk 23.4   11.0   Basic Metabolic Panel     Status: None   Collection Time: 04/12/18  2:55 PM  Result Value Ref Range   Glucose 73 65 - 99 mg/dL   BUN 10 6 - 24 mg/dL   Creatinine, Ser 1.320.89 0.76 - 1.27 mg/dL   GFR calc non Af Amer 103 >59 mL/min/1.73   GFR calc Af Amer 119 >59 mL/min/1.73   BUN/Creatinine Ratio 11 9 - 20   Sodium 141 134 - 144 mmol/L   Potassium 3.9 3.5 - 5.2 mmol/L   Chloride 104 96 - 106 mmol/L   CO2 21 20 - 29 mmol/L   Calcium 9.5 8.7 - 10.2 mg/dL  CBC with Differential     Status: Abnormal   Collection Time: 04/12/18  2:55 PM  Result Value Ref Range   WBC 8.7 3.4 - 10.8 x10E3/uL   RBC 3.95 (L) 4.14 - 5.80 x10E6/uL   Hemoglobin 11.8 (L) 13.0 - 17.7 g/dL   Hematocrit 44.034.0 (L) 10.237.5 - 51.0 %   MCV 86 79 - 97 fL   MCH 29.9 26.6 - 33.0 pg   MCHC 34.7 31.5 - 35.7 g/dL   RDW 72.514.0 36.612.3 - 44.015.4 %    Comment: **Effective April 16, 2018, the RDW pediatric reference**   interval will be removed and the adult reference interval   will be changing to:                             Male 11.7 - 15.4                                                      Male 11.6 - 15.4    Platelets 236 150 - 450 x10E3/uL   Neutrophils 48 Not Estab. %   Lymphs 41 Not Estab. %   Monocytes 6 Not Estab. %   Eos 3 Not Estab. %   Basos 1 Not Estab. %    Neutrophils Absolute 4.3 1.4 - 7.0 x10E3/uL   Lymphocytes Absolute 3.5 (H) 0.7 - 3.1 x10E3/uL   Monocytes Absolute 0.5 0.1 - 0.9 x10E3/uL   EOS (ABSOLUTE) 0.3 0.0 - 0.4 x10E3/uL   Basophils Absolute 0.0 0.0 - 0.2 x10E3/uL   Immature Granulocytes 1 Not Estab. %   Immature Grans (Abs) 0.1 0.0 - 0.1 x10E3/uL  HIV antibody (with reflex)     Status: None   Collection Time: 04/12/18  2:55 PM  Result Value Ref Range   HIV Screen 4th Generation wRfx Non Reactive Non Reactive  HgB A1c     Status: None   Collection Time: 04/12/18  3:05 PM  Result Value Ref Range   Hemoglobin A1C 5.0 4.0 - 5.6 %   HbA1c POC (<> result, manual entry)     HbA1c, POC (prediabetic range)  HbA1c, POC (controlled diabetic range)       Assessment/Plan:  ERECTILE DYSFUNCTION Patient has interest but difficulty in obtaining an erection, this is been going on for more than a year.  He says that he does not know a specific onset.  He denies any urinary symptoms, pain, numbness.  We discussed that this is more of a vascular problem than likely to be testosterone.  With testosterone test being over $700 cash, he declines to get it done at this time in case his insurance does not cover it and we will continue with the rest of the normal work-up.  For the same reasons he decides not to get the food allergy testing he had requested his I explained that this is unlikely to be contributory and he has no food allergy symptoms.  We will order HIV, CBC, lipids, BMP, TSH    Nonintractable headache Patient describes approximately 2/month migraines which is been consistent for years, no aura but with photophobia they resolved with a triptan and sleeping for most today.  He denies any neurological symptoms, vertigo, visual changes  Refill chronic triptan  Chronic fatigue Patient describes chronic fatigue, with no explanation of specific deficits.  Just says he does not feel like he has energy.  We will draw CBC, TSH, BMP,  lipids.  Patient had discussed wanting testosterone labs as well but as we are not sure this would be covered he will check with his insurance because he does not want to pay the potentially $700 at a pocket  Screen for STD (sexually transmitted disease) Patient has not been sexually active in approximately the last year due to erectile dysfunction but says that he has been active in the past when mentioned that he did not have an HIV on file he requested 1  Order HIV  Hyperglycemia Patient has history of hyperglycemia within the last 2 years although last was normal.  He requests an A1c  A1c ordered  Mixed hyperlipidemia Lipid panel indicated mixed hyperlipidemia, ASCVD risk called for moderate statin therapy if patient with risk factors as he is a smoker I will order atorvastatin 20, patient called and notified.   Marthenia Rolling, DO FAMILY MEDICINE RESIDENT - PGY2 04/13/2018 9:05 AM

## 2018-04-12 NOTE — Telephone Encounter (Signed)
Rx re-sent to different [facility]. Ples Specter, RN [facility] RN)

## 2018-04-13 DIAGNOSIS — Z113 Encounter for screening for infections with a predominantly sexual mode of transmission: Secondary | ICD-10-CM | POA: Insufficient documentation

## 2018-04-13 DIAGNOSIS — E782 Mixed hyperlipidemia: Secondary | ICD-10-CM | POA: Insufficient documentation

## 2018-04-13 DIAGNOSIS — R739 Hyperglycemia, unspecified: Secondary | ICD-10-CM | POA: Insufficient documentation

## 2018-04-13 DIAGNOSIS — R5382 Chronic fatigue, unspecified: Secondary | ICD-10-CM | POA: Insufficient documentation

## 2018-04-13 LAB — BASIC METABOLIC PANEL
BUN/Creatinine Ratio: 11 (ref 9–20)
BUN: 10 mg/dL (ref 6–24)
CALCIUM: 9.5 mg/dL (ref 8.7–10.2)
CO2: 21 mmol/L (ref 20–29)
Chloride: 104 mmol/L (ref 96–106)
Creatinine, Ser: 0.89 mg/dL (ref 0.76–1.27)
GFR calc Af Amer: 119 mL/min/{1.73_m2} (ref >59)
GFR calc non Af Amer: 103 mL/min/{1.73_m2} (ref >59)
Glucose: 73 mg/dL (ref 65–99)
Potassium: 3.9 mmol/L (ref 3.5–5.2)
Sodium: 141 mmol/L (ref 134–144)

## 2018-04-13 LAB — LIPID PANEL
Chol/HDL Ratio: 7.1 ratio — ABNORMAL HIGH (ref 0.0–5.0)
Cholesterol, Total: 240 mg/dL — ABNORMAL HIGH (ref 100–199)
HDL: 34 mg/dL — ABNORMAL LOW (ref >39)
LDL Calculated: 130 mg/dL — ABNORMAL HIGH (ref 0–99)
Triglycerides: 381 mg/dL — ABNORMAL HIGH (ref 0–149)
VLDL Cholesterol Cal: 76 mg/dL — ABNORMAL HIGH (ref 5–40)

## 2018-04-13 LAB — CBC WITH DIFFERENTIAL/PLATELET
Basophils Absolute: 0 10*3/uL (ref 0.0–0.2)
Basos: 1 %
EOS (ABSOLUTE): 0.3 10*3/uL (ref 0.0–0.4)
Eos: 3 %
Hematocrit: 34 % — ABNORMAL LOW (ref 37.5–51.0)
Hemoglobin: 11.8 g/dL — ABNORMAL LOW (ref 13.0–17.7)
Immature Grans (Abs): 0.1 10*3/uL (ref 0.0–0.1)
Immature Granulocytes: 1 %
Lymphocytes Absolute: 3.5 10*3/uL — ABNORMAL HIGH (ref 0.7–3.1)
Lymphs: 41 %
MCH: 29.9 pg (ref 26.6–33.0)
MCHC: 34.7 g/dL (ref 31.5–35.7)
MCV: 86 fL (ref 79–97)
Monocytes Absolute: 0.5 10*3/uL (ref 0.1–0.9)
Monocytes: 6 %
Neutrophils Absolute: 4.3 10*3/uL (ref 1.4–7.0)
Neutrophils: 48 %
Platelets: 236 10*3/uL (ref 150–450)
RBC: 3.95 x10E6/uL — ABNORMAL LOW (ref 4.14–5.80)
RDW: 14 % (ref 12.3–15.4)
WBC: 8.7 10*3/uL (ref 3.4–10.8)

## 2018-04-13 LAB — HIV ANTIBODY (ROUTINE TESTING W REFLEX): HIV Screen 4th Generation wRfx: NONREACTIVE

## 2018-04-13 LAB — TSH: TSH: 0.895 u[IU]/mL (ref 0.450–4.500)

## 2018-04-13 MED ORDER — ATORVASTATIN CALCIUM 20 MG PO TABS: 20.0000 mg | ORAL_TABLET | Freq: Every day | ORAL | 3 refills | Status: AC

## 2018-04-13 NOTE — Assessment & Plan Note (Signed)
Patient has history of hyperglycemia within the last 2 years although last was normal.  He requests an A1c  A1c ordered

## 2018-04-13 NOTE — Assessment & Plan Note (Signed)
Patient has interest but difficulty in obtaining an erection, this is been going on for more than a year.  He says that he does not know a specific onset.  He denies any urinary symptoms, pain, numbness.  We discussed that this is more of a vascular problem than likely to be testosterone.  With testosterone test being over $700 cash, he declines to get it done at this time in case his insurance does not cover it and we will continue with the rest of the normal work-up.  For the same reasons he decides not to get the food allergy testing he had requested his I explained that this is unlikely to be contributory and he has no food allergy symptoms.  We will order HIV, CBC, lipids, BMP, TSH

## 2018-04-13 NOTE — Assessment & Plan Note (Signed)
Patient describes approximately 2/month migraines which is been consistent for years, no aura but with photophobia they resolved with a triptan and sleeping for most today.  He denies any neurological symptoms, vertigo, visual changes  Refill chronic triptan

## 2018-04-13 NOTE — Assessment & Plan Note (Signed)
Lipid panel indicated mixed hyperlipidemia, ASCVD risk called for moderate statin therapy if patient with risk factors as he is a smoker I will order atorvastatin 20, patient called and notified.

## 2018-04-13 NOTE — Assessment & Plan Note (Signed)
Patient describes chronic fatigue, with no explanation of specific deficits.  Just says he does not feel like he has energy.  We will draw CBC, TSH, BMP, lipids.  Patient had discussed wanting testosterone labs as well but as we are not sure this would be covered he will check with his insurance because he does not want to pay the potentially $700 at a pocket

## 2018-04-13 NOTE — Assessment & Plan Note (Signed)
Patient has not been sexually active in approximately the last year due to erectile dysfunction but says that he has been active in the past when mentioned that he did not have an HIV on file he requested 1  Order HIV

## 2019-05-16 ENCOUNTER — Ambulatory Visit: Payer: Self-pay | Attending: Internal Medicine

## 2019-05-16 ENCOUNTER — Ambulatory Visit: Payer: Self-pay

## 2019-05-16 DIAGNOSIS — Z20822 Contact with and (suspected) exposure to covid-19: Secondary | ICD-10-CM

## 2019-05-17 LAB — NOVEL CORONAVIRUS, NAA: SARS-CoV-2, NAA: NOT DETECTED

## 2019-07-20 ENCOUNTER — Ambulatory Visit: Payer: Self-pay | Attending: Internal Medicine

## 2019-07-20 DIAGNOSIS — Z23 Encounter for immunization: Secondary | ICD-10-CM

## 2019-07-20 NOTE — Progress Notes (Signed)
   Covid-19 Vaccination Clinic  Name:  Bryan Blake    MRN: [id number] DOB: 10/27/1970  07/20/2019  Mr. [name] was observed post Covid-19 immunization for 15 minutes without incident. He was provided with Vaccine Information Sheet and instruction to access the V-Safe system.   Mr. [name] was instructed to call 911 with any severe reactions post vaccine: Marland Kitchen Difficulty breathing  . Swelling of face and throat  . A fast heartbeat  . A bad rash all over body  . Dizziness and weakness   Immunizations Administered    Name Date Dose VIS Date Route   Pfizer COVID-19 Vaccine 07/20/2019 12:04 PM 0.3 mL 03/22/2019 Intramuscular   Manufacturer: [employer], [employer]   Lot: [id number]   NDC: [address]-4

## 2019-08-14 ENCOUNTER — Ambulatory Visit: Payer: Self-pay | Attending: Internal Medicine

## 2019-08-14 DIAGNOSIS — Z23 Encounter for immunization: Secondary | ICD-10-CM

## 2019-08-14 NOTE — Progress Notes (Signed)
   Covid-19 Vaccination Clinic  Name:  CUNG MASTERSON    MRN: [id number] DOB: 12/01/1970  08/14/2019  Mr. Spring was observed post Covid-19 immunization for 15 minutes without incident. He was provided with Vaccine Information Sheet and instruction to access the V-Safe system.   Mr. Portlock was instructed to call 911 with any severe reactions post vaccine: Marland Kitchen Difficulty breathing  . Swelling of face and throat  . A fast heartbeat  . A bad rash all over body  . Dizziness and weakness   Immunizations Administered    Name Date Dose VIS Date Route   Pfizer COVID-19 Vaccine 08/14/2019  4:08 PM 0.3 mL 06/05/2018 Intramuscular   Manufacturer: [employer], [employer]   Lot: [id number]   NDC: [address]-7

## 2020-01-05 ENCOUNTER — Other Ambulatory Visit: Payer: Self-pay

## 2020-01-05 ENCOUNTER — Encounter (HOSPITAL_COMMUNITY): Payer: Self-pay

## 2020-01-05 ENCOUNTER — Emergency Department (HOSPITAL_COMMUNITY)
Admission: EM | Admit: 2020-01-05 | Discharge: 2020-01-05 | Disposition: A | Discharge status: Home / Self Care | Payer: Commercial Managed Care - PPO | Attending: Emergency Medicine | Admitting: Emergency Medicine

## 2020-01-05 DIAGNOSIS — M25512 Pain in left shoulder: Secondary | ICD-10-CM | POA: Diagnosis not present

## 2020-01-05 DIAGNOSIS — M79602 Pain in left arm: Secondary | ICD-10-CM | POA: Diagnosis not present

## 2020-01-05 DIAGNOSIS — Z5321 Procedure and treatment not carried out due to patient leaving prior to being seen by health care provider: Secondary | ICD-10-CM | POA: Insufficient documentation

## 2020-01-05 NOTE — ED Triage Notes (Signed)
Patient complains of left arm and shoulder pain x 3 days, denies trauma. States that the pain moves from shoulder to hand

## 2020-01-05 NOTE — ED Notes (Signed)
Pt did not answer x 3 

## 2020-03-18 ENCOUNTER — Other Ambulatory Visit: Payer: Self-pay

## 2020-03-18 ENCOUNTER — Ambulatory Visit
Admission: EM | Admit: 2020-03-18 | Discharge: 2020-03-18 | Disposition: A | Discharge status: Home / Self Care | Payer: Commercial Managed Care - PPO

## 2020-03-18 DIAGNOSIS — N6310 Unspecified lump in the right breast, unspecified quadrant: Secondary | ICD-10-CM | POA: Diagnosis not present

## 2020-03-18 NOTE — Discharge Instructions (Addendum)
Please call breast imaging center tomorrow: [phone] to schedule appointment.
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
## 2020-03-18 NOTE — ED Triage Notes (Signed)
Patient states he noticed a small mass behind his nipple today. Patient states it is not painful to palpation but it is noticeable. Pt is aox4 and ambulatory.

## 2020-03-19 ENCOUNTER — Other Ambulatory Visit: Payer: Self-pay | Admitting: Emergency Medicine

## 2020-03-19 DIAGNOSIS — N6310 Unspecified lump in the right breast, unspecified quadrant: Secondary | ICD-10-CM

## 2020-05-05 ENCOUNTER — Other Ambulatory Visit: Payer: Commercial Managed Care - PPO

## 2020-05-20 ENCOUNTER — Ambulatory Visit: Payer: Commercial Managed Care - PPO

## 2020-05-20 ENCOUNTER — Ambulatory Visit
Admission: RE | Admit: 2020-05-20 | Discharge: 2020-05-20 | Disposition: A | Discharge status: Home / Self Care | Payer: Commercial Managed Care - PPO | Source: Ambulatory Visit | Attending: Emergency Medicine | Admitting: Emergency Medicine

## 2020-05-20 ENCOUNTER — Other Ambulatory Visit: Payer: Self-pay

## 2020-05-20 DIAGNOSIS — N6310 Unspecified lump in the right breast, unspecified quadrant: Secondary | ICD-10-CM

## 2021-07-30 IMAGING — MG DIGITAL DIAGNOSTIC BILAT W/ TOMO W/ CAD
6 of 10 series · 6 of 30 positions shown · non-contrast
Comparison: None.

CLINICAL DATA: Palpable lump in the right retroareolar region.

EXAM:
DIGITAL DIAGNOSTIC BILATERAL MAMMOGRAM WITH TOMOSYNTHESIS AND CAD
TECHNIQUE: Bilateral digital diagnostic mammography and breast tomosynthesis
was performed. The images were evaluated with computer-aided
detection.

[R CC synth-2D (1 of 2)]
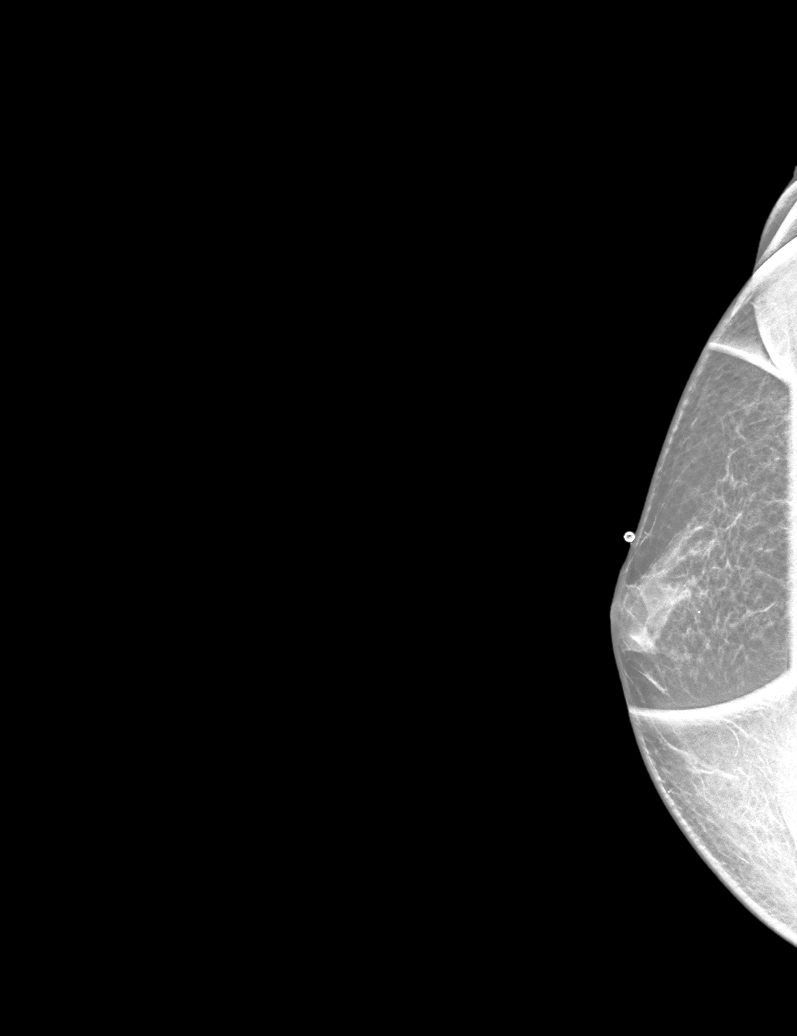

[L CC synth-2D]
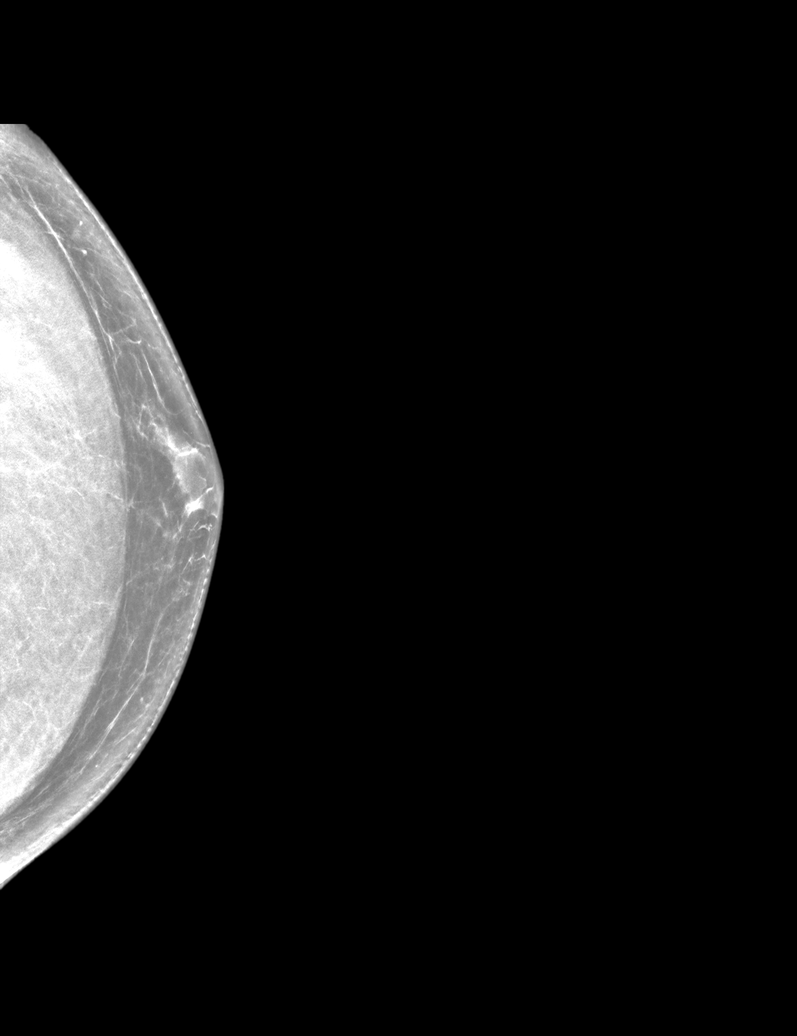

[R MLO synth-2D]
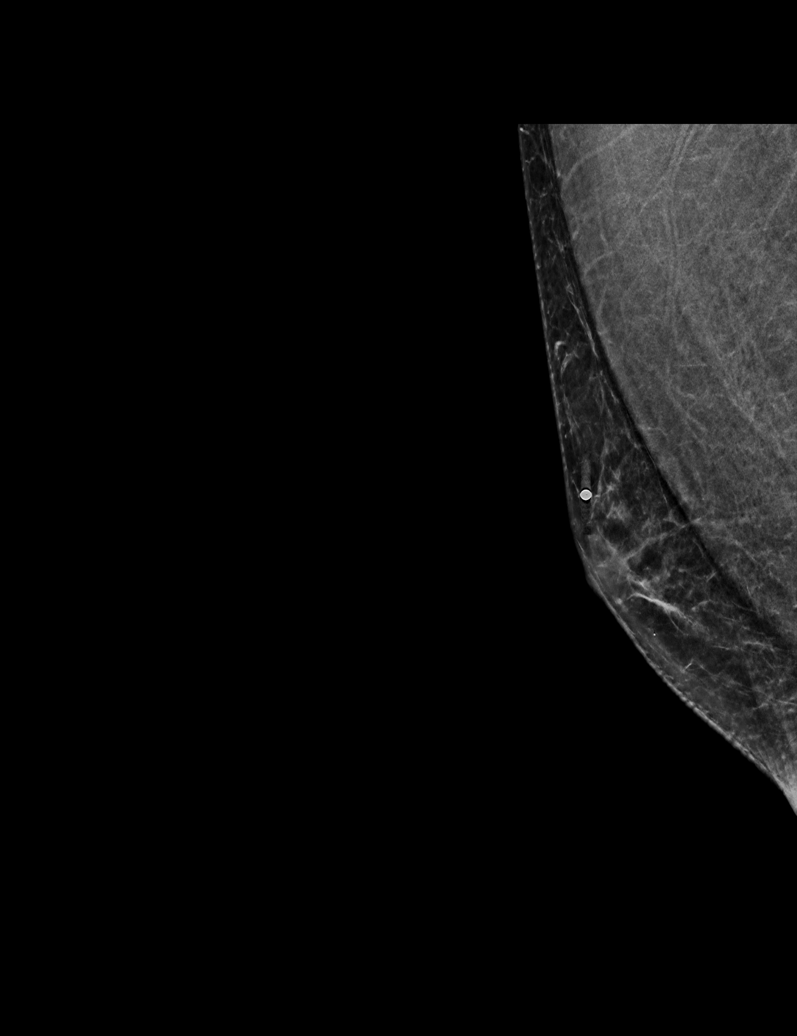

[R CC synth-2D (2 of 2)]
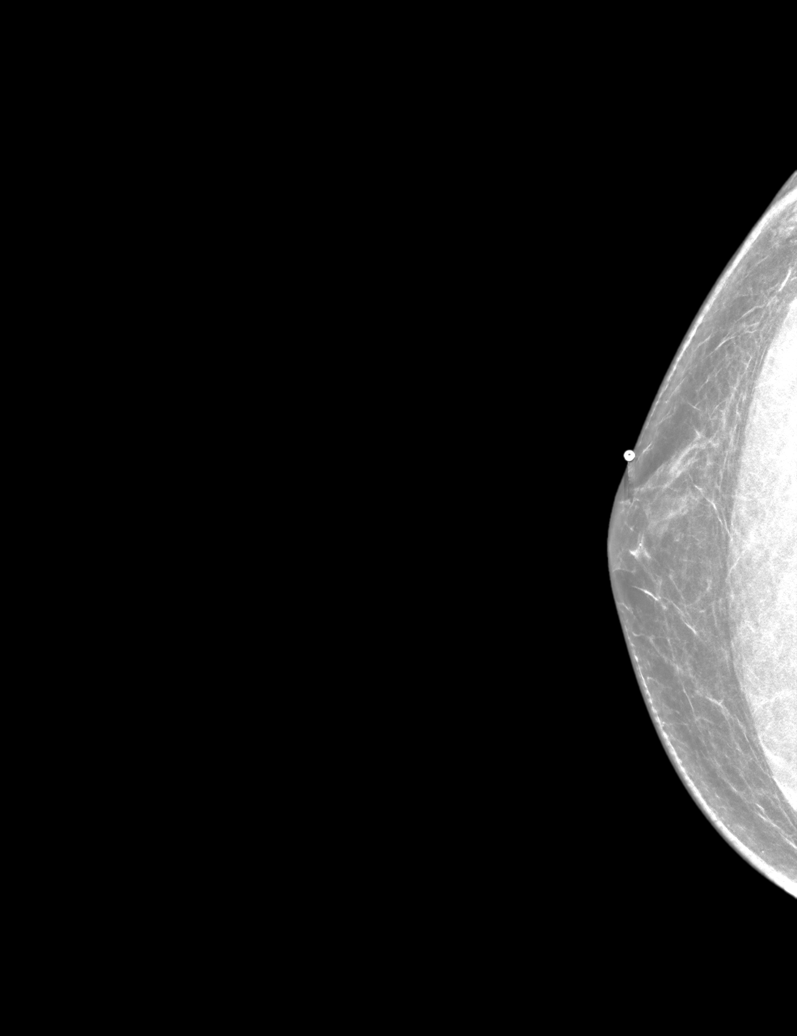

[L MLO synth-2D]
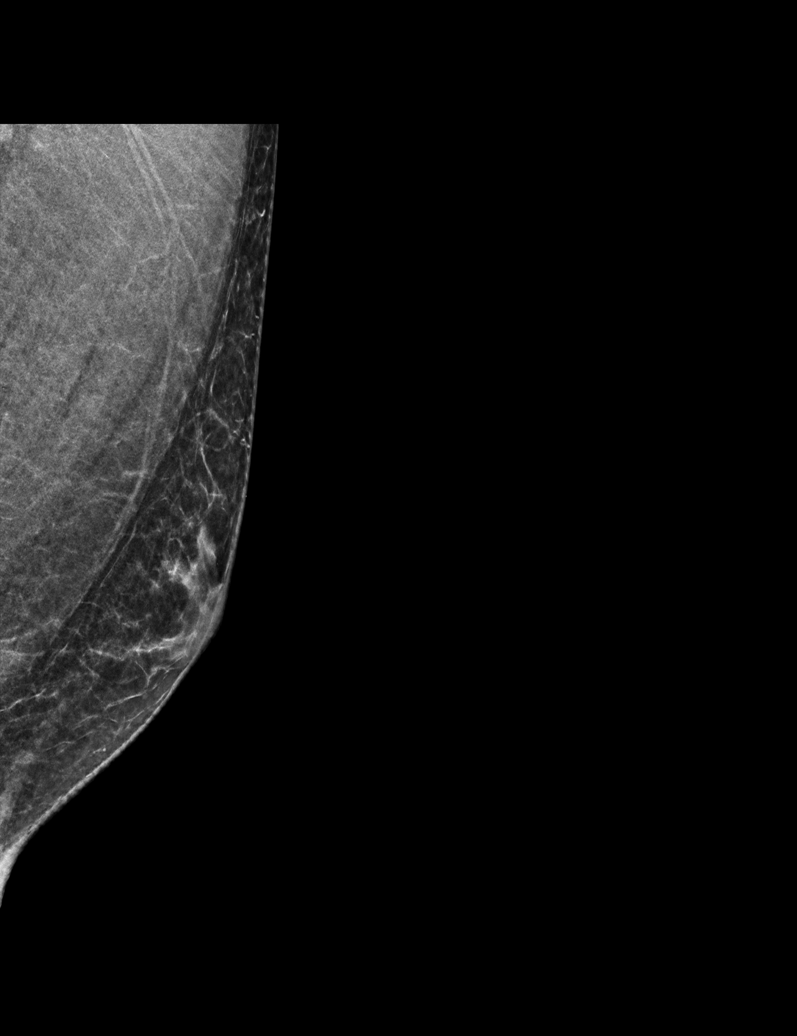

[R CC tomo · tomo slice 29/58.0]
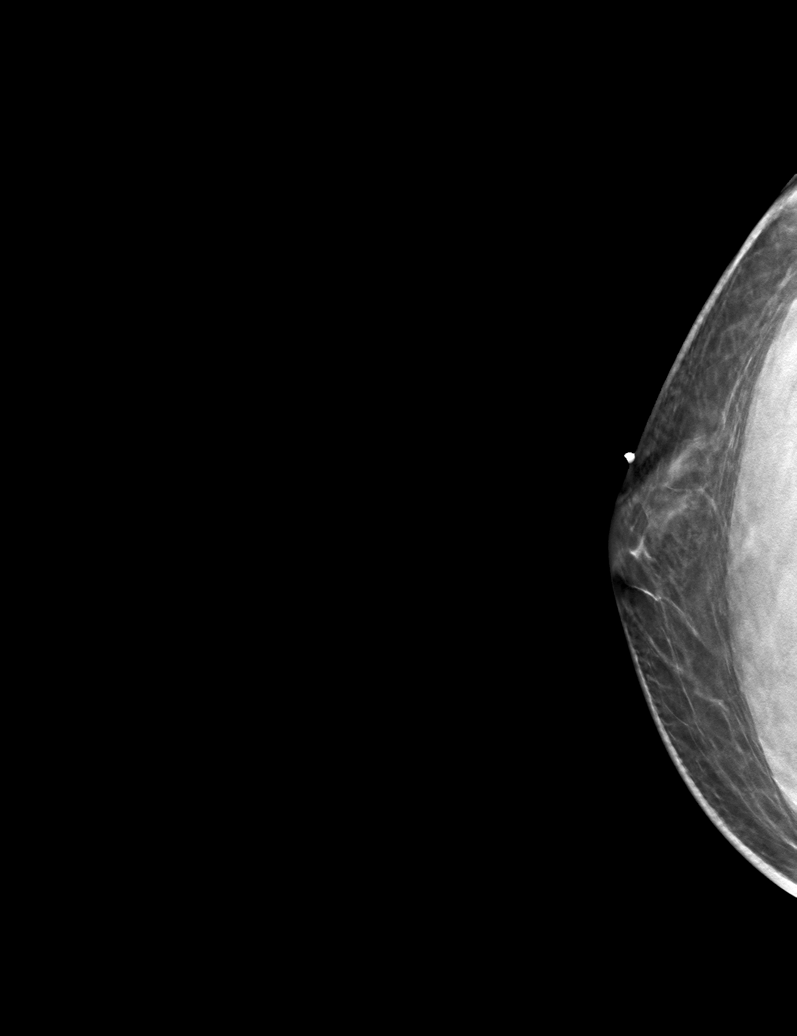

[6 of 30 positions shown; findings below may reference images not displayed]

ACR Breast Density Category b: There are scattered areas of
fibroglandular density.
FINDINGS: No suspicious masses, calcifications, or distortion are identified
in either breast. Bilateral gynecomastia, right greater than left,
is identified.
IMPRESSION: Patient is palpating right gynecomastia. No suspicious findings in
either breast.

RECOMMENDATION:
Treatment of the patient's gynecomastia should be based on clinical
and physical exam given lack of imaging findings. No imaging
follow-up necessary.

I have discussed the findings and recommendations with the patient.
If applicable, a reminder letter will be sent to the patient
regarding the next appointment.

BI-RADS CATEGORY  2: Benign.

## 2021-09-14 ENCOUNTER — Encounter: Payer: Self-pay | Admitting: *Deleted
